# Patient Record
Sex: Female | Born: 1935 | Race: White | Hispanic: No | Smoking: Never smoker
Health system: Southern US, Community
[De-identification: ages and names within clinical notes are randomized; demographics above are authoritative.]

## PROBLEM LIST (undated history)

## (undated) DIAGNOSIS — F0280 Dementia in other diseases classified elsewhere without behavioral disturbance: Secondary | ICD-10-CM

## (undated) DIAGNOSIS — F32A Depression, unspecified: Secondary | ICD-10-CM

## (undated) DIAGNOSIS — Z515 Encounter for palliative care: Secondary | ICD-10-CM

## (undated) DIAGNOSIS — F329 Major depressive disorder, single episode, unspecified: Secondary | ICD-10-CM

## (undated) DIAGNOSIS — G309 Alzheimer's disease, unspecified: Secondary | ICD-10-CM

## (undated) DIAGNOSIS — D649 Anemia, unspecified: Secondary | ICD-10-CM

---

## 2009-07-29 ENCOUNTER — Inpatient Hospital Stay (HOSPITAL_COMMUNITY): Admission: EM | Admit: 2009-07-29 | Discharge: 2009-08-07 | Payer: Self-pay | Admitting: Emergency Medicine

## 2009-07-30 ENCOUNTER — Ambulatory Visit: Payer: Self-pay | Admitting: Vascular Surgery

## 2009-07-30 ENCOUNTER — Encounter (INDEPENDENT_AMBULATORY_CARE_PROVIDER_SITE_OTHER): Payer: Self-pay | Admitting: Internal Medicine

## 2009-07-31 ENCOUNTER — Encounter (INDEPENDENT_AMBULATORY_CARE_PROVIDER_SITE_OTHER): Payer: Self-pay | Admitting: Internal Medicine

## 2009-10-19 ENCOUNTER — Emergency Department (HOSPITAL_COMMUNITY): Admission: EM | Admit: 2009-10-19 | Discharge: 2009-10-19 | Payer: Self-pay | Admitting: Emergency Medicine

## 2009-11-29 ENCOUNTER — Inpatient Hospital Stay (HOSPITAL_COMMUNITY): Admission: EM | Admit: 2009-11-29 | Discharge: 2009-11-30 | Payer: Self-pay | Admitting: Emergency Medicine

## 2009-12-15 ENCOUNTER — Observation Stay (HOSPITAL_COMMUNITY): Admission: EM | Admit: 2009-12-15 | Discharge: 2009-12-16 | Payer: Self-pay | Admitting: Emergency Medicine

## 2010-01-24 ENCOUNTER — Emergency Department (HOSPITAL_COMMUNITY): Admission: EM | Admit: 2010-01-24 | Discharge: 2010-01-24 | Payer: Self-pay | Admitting: Emergency Medicine

## 2010-06-27 ENCOUNTER — Emergency Department (HOSPITAL_COMMUNITY)
Admission: EM | Admit: 2010-06-27 | Discharge: 2010-06-27 | Disposition: A | Discharge status: Skilled Nursing Facility | Payer: PRIVATE HEALTH INSURANCE | Attending: Emergency Medicine | Admitting: Emergency Medicine

## 2010-06-27 DIAGNOSIS — G309 Alzheimer's disease, unspecified: Secondary | ICD-10-CM | POA: Insufficient documentation

## 2010-06-27 DIAGNOSIS — F0280 Dementia in other diseases classified elsewhere without behavioral disturbance: Secondary | ICD-10-CM | POA: Insufficient documentation

## 2010-06-27 DIAGNOSIS — Y9210 Unspecified residential institution as the place of occurrence of the external cause: Secondary | ICD-10-CM | POA: Insufficient documentation

## 2010-06-27 DIAGNOSIS — F329 Major depressive disorder, single episode, unspecified: Secondary | ICD-10-CM | POA: Insufficient documentation

## 2010-06-27 DIAGNOSIS — R5381 Other malaise: Secondary | ICD-10-CM | POA: Insufficient documentation

## 2010-06-27 DIAGNOSIS — F3289 Other specified depressive episodes: Secondary | ICD-10-CM | POA: Insufficient documentation

## 2010-06-27 DIAGNOSIS — W06XXXA Fall from bed, initial encounter: Secondary | ICD-10-CM | POA: Insufficient documentation

## 2010-06-27 DIAGNOSIS — Z66 Do not resuscitate: Secondary | ICD-10-CM | POA: Insufficient documentation

## 2010-06-27 LAB — POCT I-STAT, CHEM 8
BUN: 29 mg/dL — ABNORMAL HIGH (ref 6–23)
Calcium, Ion: 1.13 mmol/L (ref 1.12–1.32)
Creatinine, Ser: 1.2 mg/dL (ref 0.4–1.2)
Glucose, Bld: 79 mg/dL (ref 70–99)
HCT: 40 % (ref 36.0–46.0)
Hemoglobin: 13.6 g/dL (ref 12.0–15.0)

## 2010-07-26 LAB — URINE CULTURE
Colony Count: NO GROWTH
Culture  Setup Time: 201108071207

## 2010-07-26 LAB — URINALYSIS, ROUTINE W REFLEX MICROSCOPIC
Glucose, UA: NEGATIVE mg/dL
Ketones, ur: NEGATIVE mg/dL
Specific Gravity, Urine: 1.014 (ref 1.005–1.030)

## 2010-07-26 LAB — CK TOTAL AND CKMB (NOT AT ARMC)
CK, MB: 2.2 ng/mL (ref 0.3–4.0)
Total CK: 53 U/L (ref 7–177)

## 2010-07-26 LAB — CARDIAC PANEL(CRET KIN+CKTOT+MB+TROPI)
CK, MB: 1.9 ng/mL (ref 0.3–4.0)
Relative Index: INVALID (ref 0.0–2.5)
Relative Index: INVALID (ref 0.0–2.5)
Troponin I: 0.01 ng/mL (ref 0.00–0.06)

## 2010-07-26 LAB — CBC
HCT: 35.3 % — ABNORMAL LOW (ref 36.0–46.0)
Hemoglobin: 11.1 g/dL — ABNORMAL LOW (ref 12.0–15.0)
MCHC: 31.4 g/dL (ref 30.0–36.0)
MCV: 82.5 fL (ref 78.0–100.0)
Platelets: 327 10*3/uL (ref 150–400)
RDW: 18.3 % — ABNORMAL HIGH (ref 11.5–15.5)

## 2010-07-26 LAB — BASIC METABOLIC PANEL
CO2: 26 mEq/L (ref 19–32)
Calcium: 8.7 mg/dL (ref 8.4–10.5)
GFR calc Af Amer: >60 mL/min (ref >60)
Glucose, Bld: 95 mg/dL (ref 70–99)
Potassium: 3.9 mEq/L (ref 3.5–5.1)

## 2010-07-26 LAB — DIFFERENTIAL
Basophils Absolute: 0 10*3/uL (ref 0.0–0.1)
Eosinophils Absolute: 0.3 10*3/uL (ref 0.0–0.7)
Lymphocytes Relative: 18 % (ref 12–46)
Monocytes Absolute: 0.9 10*3/uL (ref 0.1–1.0)

## 2010-07-27 LAB — DIFFERENTIAL
Basophils Relative: 0 % (ref 0–1)
Lymphs Abs: 1.1 10*3/uL (ref 0.7–4.0)
Monocytes Absolute: 0.8 10*3/uL (ref 0.1–1.0)
Neutro Abs: 5.1 10*3/uL (ref 1.7–7.7)

## 2010-07-27 LAB — IRON AND TIBC
Iron: 31 ug/dL — ABNORMAL LOW (ref 42–135)
TIBC: 345 ug/dL (ref 250–470)
UIBC: 314 ug/dL

## 2010-07-27 LAB — RETICULOCYTES
Retic Count, Absolute: 45.7 10*3/uL (ref 19.0–186.0)
Retic Ct Pct: 1.2 % (ref 0.4–3.1)

## 2010-07-27 LAB — BASIC METABOLIC PANEL
BUN: 24 mg/dL — ABNORMAL HIGH (ref 6–23)
Calcium: 8.6 mg/dL (ref 8.4–10.5)
Chloride: 106 mEq/L (ref 96–112)
Creatinine, Ser: 1.11 mg/dL (ref 0.4–1.2)
GFR calc non Af Amer: 46 mL/min — ABNORMAL LOW (ref >60)
GFR calc non Af Amer: 47 mL/min — ABNORMAL LOW (ref >60)
Glucose, Bld: 93 mg/dL (ref 70–99)
Potassium: 3.6 mEq/L (ref 3.5–5.1)
Sodium: 141 mEq/L (ref 135–145)
Sodium: 142 mEq/L (ref 135–145)

## 2010-07-27 LAB — CBC
HCT: 29.9 % — ABNORMAL LOW (ref 36.0–46.0)
Hemoglobin: 8.7 g/dL — ABNORMAL LOW (ref 12.0–15.0)
Hemoglobin: 9.9 g/dL — ABNORMAL LOW (ref 12.0–15.0)
MCHC: 32.3 g/dL (ref 30.0–36.0)
MCHC: 33 g/dL (ref 30.0–36.0)
RDW: 15.9 % — ABNORMAL HIGH (ref 11.5–15.5)
RDW: 17.6 % — ABNORMAL HIGH (ref 11.5–15.5)
WBC: 7.3 10*3/uL (ref 4.0–10.5)
WBC: 7.6 10*3/uL (ref 4.0–10.5)

## 2010-07-27 LAB — URINALYSIS, ROUTINE W REFLEX MICROSCOPIC
Bilirubin Urine: NEGATIVE
Glucose, UA: NEGATIVE mg/dL
Hgb urine dipstick: NEGATIVE
Ketones, ur: NEGATIVE mg/dL
Nitrite: NEGATIVE
Specific Gravity, Urine: 1.021 (ref 1.005–1.030)
Urobilinogen, UA: 0.2 mg/dL (ref 0.0–1.0)
pH: 6 (ref 5.0–8.0)

## 2010-07-27 LAB — CROSSMATCH: Antibody Screen: NEGATIVE

## 2010-07-27 LAB — FERRITIN: Ferritin: 14 ng/mL (ref 10–291)

## 2010-07-27 LAB — TSH: TSH: 4.231 u[IU]/mL (ref 0.350–4.500)

## 2010-07-27 LAB — URINE CULTURE: Culture: NO GROWTH

## 2010-07-27 LAB — VITAMIN B12: Vitamin B-12: 582 pg/mL (ref 211–911)

## 2010-07-27 LAB — MAGNESIUM: Magnesium: 2.1 mg/dL (ref 1.5–2.5)

## 2010-07-27 LAB — HEMOCCULT GUIAC POC 1CARD (OFFICE): Fecal Occult Bld: POSITIVE

## 2010-07-27 LAB — ABO/RH: ABO/RH(D): O POS

## 2010-07-27 LAB — MRSA PCR SCREENING: MRSA by PCR: NEGATIVE

## 2010-07-27 LAB — HEMOGLOBIN AND HEMATOCRIT, BLOOD: HCT: 30 % — ABNORMAL LOW (ref 36.0–46.0)

## 2010-08-05 LAB — BASIC METABOLIC PANEL
BUN: 14 mg/dL (ref 6–23)
BUN: 7 mg/dL (ref 6–23)
CO2: 24 mEq/L (ref 19–32)
Calcium: 8.4 mg/dL (ref 8.4–10.5)
Calcium: 8.6 mg/dL (ref 8.4–10.5)
Chloride: 102 mEq/L (ref 96–112)
Chloride: 106 mEq/L (ref 96–112)
Creatinine, Ser: 0.87 mg/dL (ref 0.4–1.2)
Creatinine, Ser: 0.99 mg/dL (ref 0.4–1.2)
GFR calc Af Amer: >60 mL/min (ref >60)
GFR calc Af Amer: >60 mL/min (ref >60)
GFR calc non Af Amer: 56 mL/min — ABNORMAL LOW (ref >60)
GFR calc non Af Amer: >60 mL/min (ref >60)
GFR calc non Af Amer: >60 mL/min (ref >60)
Glucose, Bld: 98 mg/dL (ref 70–99)
Potassium: 3.3 mEq/L — ABNORMAL LOW (ref 3.5–5.1)
Potassium: 3.6 mEq/L (ref 3.5–5.1)
Sodium: 140 mEq/L (ref 135–145)

## 2010-08-05 LAB — COMPREHENSIVE METABOLIC PANEL
ALT: 25 U/L (ref 0–35)
Albumin: 3.4 g/dL — ABNORMAL LOW (ref 3.5–5.2)
BUN: 18 mg/dL (ref 6–23)
Calcium: 8.4 mg/dL (ref 8.4–10.5)
Glucose, Bld: 77 mg/dL (ref 70–99)
Sodium: 134 mEq/L — ABNORMAL LOW (ref 135–145)
Total Protein: 6.6 g/dL (ref 6.0–8.3)

## 2010-08-05 LAB — FERRITIN: Ferritin: 174 ng/mL (ref 10–291)

## 2010-08-05 LAB — CBC
HCT: 23.4 % — ABNORMAL LOW (ref 36.0–46.0)
HCT: 40.7 % (ref 36.0–46.0)
Hemoglobin: 13.5 g/dL (ref 12.0–15.0)
Hemoglobin: 5.7 g/dL — CL (ref 12.0–15.0)
MCHC: 33.2 g/dL (ref 30.0–36.0)
MCHC: 33.9 g/dL (ref 30.0–36.0)
MCV: 92.9 fL (ref 78.0–100.0)
Platelets: 257 10*3/uL (ref 150–400)
RBC: 2.51 MIL/uL — ABNORMAL LOW (ref 3.87–5.11)
RBC: 4.38 MIL/uL (ref 3.87–5.11)
RDW: 14.4 % (ref 11.5–15.5)

## 2010-08-05 LAB — POCT I-STAT, CHEM 8
Creatinine, Ser: 0.8 mg/dL (ref 0.4–1.2)
HCT: 42 % (ref 36.0–46.0)
Hemoglobin: 14.3 g/dL (ref 12.0–15.0)
Potassium: 3.6 mEq/L (ref 3.5–5.1)
Sodium: 135 mEq/L (ref 135–145)
TCO2: 25 mmol/L (ref 0–100)

## 2010-08-05 LAB — CK TOTAL AND CKMB (NOT AT ARMC)
CK, MB: 6.4 ng/mL (ref 0.3–4.0)
Relative Index: 2.9 — ABNORMAL HIGH (ref 0.0–2.5)
Total CK: 219 U/L — ABNORMAL HIGH (ref 7–177)
Total CK: 326 U/L — ABNORMAL HIGH (ref 7–177)

## 2010-08-05 LAB — CROSSMATCH
ABO/RH(D): O POS
Antibody Screen: NEGATIVE

## 2010-08-05 LAB — URINE CULTURE: Culture: NO GROWTH

## 2010-08-05 LAB — DIFFERENTIAL
Lymphocytes Relative: 9 % — ABNORMAL LOW (ref 12–46)
Lymphs Abs: 0.4 10*3/uL — ABNORMAL LOW (ref 0.7–4.0)
Monocytes Absolute: 0.4 10*3/uL (ref 0.1–1.0)
Monocytes Relative: 9 % (ref 3–12)
Neutro Abs: 3.3 10*3/uL (ref 1.7–7.7)
Neutrophils Relative %: 81 % — ABNORMAL HIGH (ref 43–77)

## 2010-08-05 LAB — IRON AND TIBC
Iron: 36 ug/dL — ABNORMAL LOW (ref 42–135)
Saturation Ratios: 14 % — ABNORMAL LOW (ref 20–55)
UIBC: 230 ug/dL

## 2010-08-05 LAB — FOLATE: Folate: >20 ng/mL

## 2010-08-05 LAB — VITAMIN B12: Vitamin B-12: 973 pg/mL — ABNORMAL HIGH (ref 211–911)

## 2010-08-05 LAB — MAGNESIUM: Magnesium: 2 mg/dL (ref 1.5–2.5)

## 2010-08-05 LAB — RETICULOCYTES: Retic Ct Pct: 1.1 % (ref 0.4–3.1)

## 2010-08-05 LAB — TROPONIN I: Troponin I: 0.04 ng/mL (ref 0.00–0.06)

## 2010-08-05 LAB — ABO/RH: ABO/RH(D): O POS

## 2010-08-05 LAB — TSH: TSH: 4.529 u[IU]/mL — ABNORMAL HIGH (ref 0.350–4.500)

## 2010-08-05 LAB — HEMOCCULT GUIAC POC 1CARD (OFFICE): Fecal Occult Bld: NEGATIVE

## 2010-12-03 ENCOUNTER — Emergency Department (HOSPITAL_COMMUNITY)
Admission: EM | Admit: 2010-12-03 | Discharge: 2010-12-03 | Disposition: A | Discharge status: Home / Self Care | Payer: No Typology Code available for payment source | Attending: Emergency Medicine | Admitting: Emergency Medicine

## 2010-12-03 DIAGNOSIS — R51 Headache: Secondary | ICD-10-CM | POA: Insufficient documentation

## 2010-12-03 DIAGNOSIS — Z66 Do not resuscitate: Secondary | ICD-10-CM | POA: Insufficient documentation

## 2010-12-03 DIAGNOSIS — W010XXA Fall on same level from slipping, tripping and stumbling without subsequent striking against object, initial encounter: Secondary | ICD-10-CM | POA: Insufficient documentation

## 2010-12-03 DIAGNOSIS — F0390 Unspecified dementia without behavioral disturbance: Secondary | ICD-10-CM | POA: Insufficient documentation

## 2010-12-03 DIAGNOSIS — Y9210 Unspecified residential institution as the place of occurrence of the external cause: Secondary | ICD-10-CM | POA: Insufficient documentation

## 2010-12-03 DIAGNOSIS — T1490XA Injury, unspecified, initial encounter: Secondary | ICD-10-CM | POA: Insufficient documentation

## 2011-01-25 ENCOUNTER — Emergency Department (HOSPITAL_COMMUNITY): Payer: No Typology Code available for payment source

## 2011-01-25 ENCOUNTER — Emergency Department (HOSPITAL_COMMUNITY)
Admission: EM | Admit: 2011-01-25 | Discharge: 2011-01-25 | Disposition: A | Discharge status: Home / Self Care | Payer: No Typology Code available for payment source | Attending: Emergency Medicine | Admitting: Emergency Medicine

## 2011-01-25 DIAGNOSIS — S0003XA Contusion of scalp, initial encounter: Secondary | ICD-10-CM | POA: Insufficient documentation

## 2011-01-25 DIAGNOSIS — Z79899 Other long term (current) drug therapy: Secondary | ICD-10-CM | POA: Insufficient documentation

## 2011-01-25 DIAGNOSIS — G309 Alzheimer's disease, unspecified: Secondary | ICD-10-CM | POA: Insufficient documentation

## 2011-01-25 DIAGNOSIS — F3289 Other specified depressive episodes: Secondary | ICD-10-CM | POA: Insufficient documentation

## 2011-01-25 DIAGNOSIS — W19XXXA Unspecified fall, initial encounter: Secondary | ICD-10-CM | POA: Insufficient documentation

## 2011-01-25 DIAGNOSIS — IMO0002 Reserved for concepts with insufficient information to code with codable children: Secondary | ICD-10-CM | POA: Insufficient documentation

## 2011-01-25 DIAGNOSIS — I10 Essential (primary) hypertension: Secondary | ICD-10-CM | POA: Insufficient documentation

## 2011-01-25 DIAGNOSIS — R51 Headache: Secondary | ICD-10-CM | POA: Insufficient documentation

## 2011-01-25 DIAGNOSIS — S0180XA Unspecified open wound of other part of head, initial encounter: Secondary | ICD-10-CM | POA: Insufficient documentation

## 2011-01-25 DIAGNOSIS — F0280 Dementia in other diseases classified elsewhere without behavioral disturbance: Secondary | ICD-10-CM | POA: Insufficient documentation

## 2011-01-25 DIAGNOSIS — S1093XA Contusion of unspecified part of neck, initial encounter: Secondary | ICD-10-CM | POA: Insufficient documentation

## 2011-01-25 DIAGNOSIS — F329 Major depressive disorder, single episode, unspecified: Secondary | ICD-10-CM | POA: Insufficient documentation

## 2011-01-25 DIAGNOSIS — Y9210 Unspecified residential institution as the place of occurrence of the external cause: Secondary | ICD-10-CM | POA: Insufficient documentation

## 2011-01-25 DIAGNOSIS — N390 Urinary tract infection, site not specified: Secondary | ICD-10-CM | POA: Insufficient documentation

## 2011-01-25 LAB — URINALYSIS, ROUTINE W REFLEX MICROSCOPIC
Bilirubin Urine: NEGATIVE
Glucose, UA: NEGATIVE mg/dL
Ketones, ur: NEGATIVE mg/dL
Protein, ur: NEGATIVE mg/dL
Urobilinogen, UA: 0.2 mg/dL (ref 0.0–1.0)

## 2011-01-25 LAB — POCT I-STAT, CHEM 8
Calcium, Ion: 1.06 mmol/L — ABNORMAL LOW (ref 1.12–1.32)
Creatinine, Ser: 1.1 mg/dL (ref 0.50–1.10)
Hemoglobin: 13.9 g/dL (ref 12.0–15.0)
Sodium: 139 mEq/L (ref 135–145)
TCO2: 27 mmol/L (ref 0–100)

## 2011-01-25 LAB — GLUCOSE, CAPILLARY: Glucose-Capillary: 74 mg/dL (ref 70–99)

## 2011-01-27 LAB — URINE CULTURE: Culture  Setup Time: 201209151729

## 2011-07-12 ENCOUNTER — Encounter (HOSPITAL_COMMUNITY): Payer: Self-pay | Admitting: Family Medicine

## 2011-07-12 ENCOUNTER — Emergency Department (HOSPITAL_COMMUNITY)
Admission: EM | Admit: 2011-07-12 | Discharge: 2011-07-12 | Disposition: A | Discharge status: Skilled Nursing Facility | Payer: No Typology Code available for payment source | Attending: Emergency Medicine | Admitting: Emergency Medicine

## 2011-07-12 DIAGNOSIS — G309 Alzheimer's disease, unspecified: Secondary | ICD-10-CM | POA: Insufficient documentation

## 2011-07-12 DIAGNOSIS — W2209XA Striking against other stationary object, initial encounter: Secondary | ICD-10-CM | POA: Insufficient documentation

## 2011-07-12 DIAGNOSIS — IMO0002 Reserved for concepts with insufficient information to code with codable children: Secondary | ICD-10-CM | POA: Insufficient documentation

## 2011-07-12 DIAGNOSIS — S0990XA Unspecified injury of head, initial encounter: Secondary | ICD-10-CM | POA: Insufficient documentation

## 2011-07-12 DIAGNOSIS — S0081XA Abrasion of other part of head, initial encounter: Secondary | ICD-10-CM

## 2011-07-12 DIAGNOSIS — F0280 Dementia in other diseases classified elsewhere without behavioral disturbance: Secondary | ICD-10-CM | POA: Insufficient documentation

## 2011-07-12 DIAGNOSIS — Y9210 Unspecified residential institution as the place of occurrence of the external cause: Secondary | ICD-10-CM | POA: Insufficient documentation

## 2011-07-12 DIAGNOSIS — Z79899 Other long term (current) drug therapy: Secondary | ICD-10-CM | POA: Insufficient documentation

## 2011-07-12 HISTORY — DX: Alzheimer's disease, unspecified: G30.9

## 2011-07-12 HISTORY — DX: Dementia in other diseases classified elsewhere, unspecified severity, without behavioral disturbance, psychotic disturbance, mood disturbance, and anxiety: F02.80

## 2011-07-12 MED ORDER — TETANUS-DIPHTH-ACELL PERTUSSIS 5-2.5-18.5 LF-MCG/0.5 IM SUSP
0.5000 mL | Freq: Once | INTRAMUSCULAR | Status: AC
  Administered 2011-07-12: 0.5 mL via INTRAMUSCULAR
  Filled 2011-07-12: qty 0.5

## 2011-07-12 NOTE — ED Notes (Signed)
Per EMS, pt was walking and hit head on the corner of a wall. Pt has small abrasion to right eyebrow.

## 2011-07-12 NOTE — ED Provider Notes (Signed)
History     CSN: [id number]  Arrival date & time 07/12/11  1646   First MD Initiated Contact with Patient 07/12/11 1708      Chief Complaint  Patient presents with  . Head Injury    (Consider location/radiation/quality/duration/timing/severity/associated sxs/prior treatment) HPI Hx provided by EMS.  Unable to obtain Hx from the pt 2/2 severe dementia.  78 year old female with history of severe dementia sent from nursing home secondary to head injury. The injury occurred today at her nursing home.  Patient was walking and "hit her head on the corner of a wall."  No LOC, abnormal gait, or fall. Reportedly no change in patient's mental status. Patient denies pain, numbness, or weakness.  Patient was noted to have an abrasion to her right eyebrow and was sent for further evaluation.  Tetanus status unknown.   Past Medical History  Diagnosis Date  . Alzheimer's dementia     History reviewed. No pertinent past surgical history.  History reviewed. No pertinent family history.  History  Substance Use Topics  . Smoking status: Not on file  . Smokeless tobacco: Not on file  . Alcohol Use: No    OB History    Grav Para Term Preterm Abortions TAB SAB Ect Mult Living                  Review of Systems  Unable to perform ROS: Dementia  Constitutional: Negative for fever.  Respiratory: Negative for cough.   Cardiovascular: Negative for chest pain.  Gastrointestinal: Negative for abdominal pain.  Musculoskeletal: Negative for gait problem.  Skin: Positive for wound.  Neurological: Negative for syncope and weakness.    Allergies  Review of patient's allergies indicates no known allergies.  Home Medications   Current Outpatient Rx  Name Route Sig Dispense Refill  . ADULT MULTIVITAMIN W/MINERALS CH Oral Take 1 tablet by mouth daily.    Marland Kitchen OMEPRAZOLE 20 MG PO CPDR Oral Take 20 mg by mouth daily.    . SERTRALINE HCL 100 MG PO TABS Oral Take 100 mg by mouth daily.      BP  120/63  Pulse 57  Temp(Src) 98.3 F (36.8 C) (Oral)  Resp 18  SpO2 100%  Physical Exam  Nursing note and vitals reviewed. Constitutional: She is oriented to person, place, and time. No distress.       Thin, alert, oriented to first name only, pt with 2 baby dolls in her lap, smiles, follows commands although slowly  HENT:  Head: Normocephalic.    Right Ear: External ear normal.  Left Ear: External ear normal.  Nose: Nose normal.  Mouth/Throat: Oropharynx is clear and moist.       No sig edema, TTP, or deformity.  Eyes: Conjunctivae and EOM are normal. Pupils are equal, round, and reactive to light.       Pupils ~58mm  Neck: Normal range of motion. Neck supple.  Cardiovascular: Normal rate, regular rhythm and intact distal pulses.   No murmur heard. Pulmonary/Chest: Effort normal and breath sounds normal. No respiratory distress.  Abdominal: Soft. Bowel sounds are normal. There is no tenderness.  Musculoskeletal: Normal range of motion. She exhibits no edema.  Neurological: She is alert and oriented to person, place, and time.  Skin: Skin is warm and dry. No rash noted. She is not diaphoretic.  Psychiatric: She has a normal mood and affect. Judgment normal.    ED Course  Procedures (including critical care time)  Labs Reviewed - No data to  display No results found.   1. Abrasion of face       MDM  78 y.o. F with severe dementia s/p injury to her lateral Rt eye with resultant abrasion.  No h/o LOC, neuro Sx's, or AMS.  Pt denies pain.  No cervical TTP.  Abrasions to face = superficial and without deformity.  Tdap given.  Will d/c with PCP f/u PRN.      Particia Lather, MD 07/13/11 [phone]

## 2011-07-12 NOTE — ED Provider Notes (Signed)
5:40 PM  I performed a history and physical examination of Pamela Harvey and discussed her management with Dr. Lendell Caprice.  I agree with the history, physical, assessment, and plan of care, with the following exceptions: None  The patient is awake, alert, interactive, but confused. She is in no apparent distress, is smiling and laughing. Her pupils are equal round reactive to light there is a small abrasion at the lateral aspect of the right eyebrow, no hematoma, no deformity of cranial or facial bones. Patient is breathing easily in no apparent distress, skin is warm and dry.  I was present for the following procedures: None Time Spent in Critical Care of the patient: None Time spent in discussions with the patient and family: 5 minutes  Pamela Harvey D    Pamela Bonier, MD 07/12/11 [phone]

## 2011-07-12 NOTE — ED Notes (Signed)
ptar here to pick up patient,  Report called to [facility] at [facility].  No new orders.

## 2011-07-12 NOTE — Discharge Instructions (Signed)
Abrasions Abrasions are skin scrapes. Their treatment depends on how large and deep the abrasion is. Abrasions do not extend through all layers of the skin. A cut or lesion through all skin layers is called a laceration. HOME CARE INSTRUCTIONS   If you were given a dressing, change it at least once a day or as instructed by your caregiver. If the bandage sticks, soak it off with a solution of water or hydrogen peroxide.   Twice a day, wash the area with soap and water to remove all the cream/ointment. You may do this in a sink, under a tub faucet, or in a shower. Rinse off the soap and pat dry with a clean towel. Look for signs of infection (see below).   Reapply cream/ointment according to your caregiver's instruction. This will help prevent infection and keep the bandage from sticking. Telfa or gauze over the wound and under the dressing or wrap will also help keep the bandage from sticking.   If the bandage becomes wet, dirty, or develops a foul smell, change it as soon as possible.   Only take over-the-counter or prescription medicines for pain, discomfort, or fever as directed by your caregiver.  SEEK IMMEDIATE MEDICAL CARE IF:   Increasing pain in the wound.   Signs of infection develop: redness, swelling, surrounding area is tender to touch, or pus coming from the wound.   You have a fever.   Any foul smell coming from the wound or dressing.  Most skin wounds heal within ten days. Facial wounds heal faster. However, an infection may occur despite proper treatment. You should have the wound checked for signs of infection within 24 to 48 hours or sooner if problems arise. If you were not given a wound-check appointment, look closely at the wound yourself on the second day for early signs of infection listed above. MAKE SURE YOU:   Understand these instructions.   Will watch your condition.   Will get help right away if you are not doing well or get worse.  Document Released:  02/05/2005 Document Revised: 01/08/2011 Document Reviewed: 12/15/2007 [facility] Patient Information 2012 [place], [place].     Tetanus and Diphtheria Vaccine Your caregiver has suggested that you receive an immunization to prevent tetanus (lockjaw) and diphtheria. Tetanus and diphtheria are serious and deadly infectious diseases of the past that have been nearly wiped out by modern immunizations. Td or DT vaccines (shots) are the immunizations given to help prevent these illnesses. Td is the medical term for a standard tetanus dose, small diphtheria dose. DT means both in standard doses. ABOUT THE DISEASES Tetanus (lockjaw) and diphtheria are serious diseases. Tetanus is caused by a germ that lives in the soil. It enters the body through a cut or wound, often caused by a nail or broken piece of glass. You cannot catch tetanus from another person. Diphtheria spreads when germs pass from an infected person to the nose or throat of others. Tetanus causes serious, painful spasms of all muscles. It can lead to:  "Locking" of the muscles of the jaw and throat, so the patient cannot open his or her mouth or swallow.   Damage to the heart muscle.  Diphtheria causes a thick coating in the nose, throat, or airway. It can lead to:  Breathing problems.   Kidney problems.   Heart failure.   Paralysis.   Death.  ABOUT THE VACCINES  A vaccine is a shot (immunization) that can help prevent a disease. Vaccines have helped lower  the rates of getting certain diseases. If people stopped getting vaccinated, more people would develop illnesses. These vaccines can be used in three ways:  As catch-up for people who did not get all their doses when they were children.   As a booster dose every 10 years.   For protection against tetanus infection, after a wound.  Benefits of the vaccines Vaccination is the best way to protect against tetanus and diphtheria. Because of vaccination, there are fewer cases of  these diseases. Cases are rare in children because most get a routine vaccination with DTP (Diphtheria, Tetanus, and Pertussis), DTaP (Diphtheria, Tetanus, and acellular Pertussis), or DT (Diphtheria and Tetanus) vaccines. There would be many more cases if we stopped vaccinating people. Tetanus kills about 1 in 5 people who are infected. WHEN SHOULD YOU GET TD VACCINE?  Td is made for people 73 years of age and older.   People who have not gotten at least 3 doses of any tetanus and diphtheria vaccine (DTP, DTaP or DT) during their lifetime should do so using Td. After a person gets the third dose, a Td dose is needed every 10 years all through life. This is because protection fades over time. Booster shots are needed every 10 years.   Other vaccines may be given at the same time as Td.  You may not know today whether your immunizations are current. The vaccine given today is to protect you from your next cut or injury. It does not offer protection for the current injury. An immune globulin injection may be given, if protection is needed immediately. Check with your caregiver later regarding your immunization status. Tell your caregiver if the person getting the vaccine:  Has ever had a serious allergic reaction or other problem with Td, or any other tetanus and diphtheria vaccine (DTP, DTaP, or DT). People who have had a serious allergic reaction should not receive the vaccine.   Has epilepsy or another nervous system illness.   Has had Guillain Barre Syndrome (GBS) in the past.   Now has a moderate or severe illness.   Is pregnant.   If you are not sure, ask your caregiver.  WHAT ARE THE RISKS FROM TD VACCINE?  As with any medicine, there are very small risks that serious problems, even death, could occur after getting a vaccine. However, the risk of a serious side effect from the vaccine is almost zero.   The risks from the vaccine are much smaller than the risks from the diseases, if  people stopped getting vaccinated. Both diseases can cause serious health problems, which are prevented by the vaccine.   Almost all people who get Td have no problems from it.  Mild problems If mild problems occur, they usually start within hours to a day or two after vaccination. They may last 1-2 days:  Soreness, redness, or swelling where the shot was given.   Headache or tiredness.   Occasionally, a low grade fever.  These problems can be worse in adults who get Td vaccine very often. Non-aspirin medicines may be used to reduce soreness. Severe problems These problems happen very rarely:  Serious allergic reaction (at most, occurs in 1 in 1 million vaccinated persons). This occurs almost immediately, and is treatable with medicines. Signs of a serious allergic reaction include:   Difficulty breathing.   Hoarseness or wheezing.   Hives.   Dizziness.   Deep, aching pain and muscle wasting in upper arm(s).  Overall, the benefits to you  and your family from these vaccines are far greater than the risk. WHAT TO DO IF THERE IS A SERIOUS REACTION:  Call a caregiver or get the person to a doctor or emergency room right away.   Write down what happened, the date and time it happened, and tell your caregiver.   Ask your caregiver to file a Vaccine Adverse Event Report form or call, toll-free: [phone]  If you want to learn more about this vaccine, ask your caregiver. She/he can give you the vaccine package insert or suggest other sources of information. Also, the [employer] gives compensation (payment) for persons thought to be injured by vaccines. For details call, toll-free: [phone]. Document Released: 04/25/2000 Document Revised: 01/08/2011 Document Reviewed: 03/15/2009 [facility] Patient Information 2012 [place], [place].

## 2011-07-16 NOTE — ED Provider Notes (Signed)
Evaluation and management procedures were performed by the resident physician under my supervision/collaboration.    Felisa Bonier, MD 07/16/11 2033

## 2011-09-15 ENCOUNTER — Emergency Department (HOSPITAL_COMMUNITY): Payer: No Typology Code available for payment source

## 2011-09-15 ENCOUNTER — Emergency Department (HOSPITAL_COMMUNITY)
Admission: EM | Admit: 2011-09-15 | Discharge: 2011-09-15 | Disposition: A | Discharge status: Home / Self Care | Payer: No Typology Code available for payment source | Attending: Emergency Medicine | Admitting: Emergency Medicine

## 2011-09-15 ENCOUNTER — Encounter (HOSPITAL_COMMUNITY): Payer: Self-pay | Admitting: *Deleted

## 2011-09-15 DIAGNOSIS — F329 Major depressive disorder, single episode, unspecified: Secondary | ICD-10-CM | POA: Insufficient documentation

## 2011-09-15 DIAGNOSIS — S0083XA Contusion of other part of head, initial encounter: Secondary | ICD-10-CM

## 2011-09-15 DIAGNOSIS — F0280 Dementia in other diseases classified elsewhere without behavioral disturbance: Secondary | ICD-10-CM | POA: Insufficient documentation

## 2011-09-15 DIAGNOSIS — F3289 Other specified depressive episodes: Secondary | ICD-10-CM | POA: Insufficient documentation

## 2011-09-15 DIAGNOSIS — I498 Other specified cardiac arrhythmias: Secondary | ICD-10-CM | POA: Insufficient documentation

## 2011-09-15 DIAGNOSIS — Z79899 Other long term (current) drug therapy: Secondary | ICD-10-CM | POA: Insufficient documentation

## 2011-09-15 DIAGNOSIS — W19XXXA Unspecified fall, initial encounter: Secondary | ICD-10-CM | POA: Insufficient documentation

## 2011-09-15 DIAGNOSIS — S0180XA Unspecified open wound of other part of head, initial encounter: Secondary | ICD-10-CM | POA: Insufficient documentation

## 2011-09-15 DIAGNOSIS — G309 Alzheimer's disease, unspecified: Secondary | ICD-10-CM | POA: Insufficient documentation

## 2011-09-15 DIAGNOSIS — S12100A Unspecified displaced fracture of second cervical vertebra, initial encounter for closed fracture: Secondary | ICD-10-CM | POA: Insufficient documentation

## 2011-09-15 DIAGNOSIS — S12110A Anterior displaced Type II dens fracture, initial encounter for closed fracture: Secondary | ICD-10-CM

## 2011-09-15 DIAGNOSIS — Y9210 Unspecified residential institution as the place of occurrence of the external cause: Secondary | ICD-10-CM | POA: Insufficient documentation

## 2011-09-15 DIAGNOSIS — S0003XA Contusion of scalp, initial encounter: Secondary | ICD-10-CM | POA: Insufficient documentation

## 2011-09-15 HISTORY — DX: Depression, unspecified: F32.A

## 2011-09-15 HISTORY — DX: Major depressive disorder, single episode, unspecified: F32.9

## 2011-09-15 NOTE — ED Notes (Signed)
Per EMS pt from [facility], staff reports found pt on floor around 0430 this am. Did not call EMS at that time, no injuries noted. Staff re-evaluated at 0700 and found hematoma to right side of forehead. Staff states pt alert/oriented per norm. Hx of alzheimers/dementia. VSS. No blood thinners.

## 2011-09-15 NOTE — Discharge Instructions (Signed)
Keep the cervical collar on at all times except for bathing. Followup with the neurosurgeon in 2 weeks.  Cervical Spine Fracture, Stable The muscles and ligaments in your neck have been stretched and a bone in your neck appears to be broken. This is known as a cervical spine fracture. This fracture appears to be stable. This means that the chances of it causing you problems while it is healing are very small. Your caregiver feels that no harm will come to you if you are followed up at home. DIAGNOSIS  The diagnosis of your injury had been made with X-rays of your neck. Often CT scans and or MRI is used to confirm the diagnosis and clarify how your injury should be treated. Generally it is the examination of your neck, arms and legs and the history of your injury which prompts the caregiver to order these tests. HOME CARE INSTRUCTIONS  Ice packs to the neck or areas of pain approximately 3 to 4 times a day for 15 to 20 minutes. Do this for 2 days.   If given a cervical collar, wear as instructed. Do not remove any collar unless instructed by a caregiver.   Only take over-the-counter or prescription medicines for pain, discomfort, or fever as directed by your caregiver.   If your caregiver has given you a follow up appointment if is very important to keep that appointment. Not keeping the appointment could result in a chronic or permanent injury, pain, and disability. If there is any problem keeping the appointment you must call back to this facility for assistance.  Recheck with the hospital or clinic after a radiologist has read your X-rays if this was not done when you were initially evaluated. This will determine if further studies are necessary. It is your responsibility to obtain your X-ray results. Ask your caregiver how you are to be informed about your X-ray results. X-rays may be repeated in one week to three weeks. This is to:  Make sure that hairline fractures not detected on the first  X-rays are not overlooked.   Find ligaments that are completely disrupted which have left your neck unstable.  SEEK IMMEDIATE MEDICAL CARE IF:   You have increasing pain in your neck.   You develop difficulties swallowing or breathing.   You have numbness, weakness, burning pain or movement problems in the arms or legs.   You have difficulty walking.   You develop bowel or bladder retention or incontinence.   You have problems with coordination.  MAKE SURE YOU:   Understand these instructions.   Will watch your condition.   Will get help right away if you are not doing well or get worse.  Document Released: 03/15/2004 Document Revised: 04/17/2011 Document Reviewed: 12/10/2007 [facility] Patient Information 2012 [place], [place].

## 2011-09-15 NOTE — ED Provider Notes (Addendum)
History     CSN: [id number]  Arrival date & time 09/15/11  0712   First MD Initiated Contact with Patient 09/15/11 [phone]      Chief Complaint  Patient presents with  . Fall  . Bleeding/Bruising    (Consider location/radiation/quality/duration/timing/severity/associated sxs/prior treatment) Patient is a 78 y.o. female presenting with fall. The history is provided by the nursing home. The history is limited by the condition of the patient (dementia).  Fall  She was reported to have been found on the floor and the nursing home where she lives. She did not seem to have any injury at that time, but this morning, she was noted to have bruising on the right side of her for head. She was reported to be ambulatory and her mental status is reported to be unchanged from her baseline. Her medication list does not include any anticoagulants or antiplatelet medications.  Past Medical History  Diagnosis Date  . Alzheimer's dementia   . Depressed     History reviewed. No pertinent past surgical history.  No family history on file.  History  Substance Use Topics  . Smoking status: Not on file  . Smokeless tobacco: Not on file  . Alcohol Use: No    OB History    Grav Para Term Preterm Abortions TAB SAB Ect Mult Living                  Review of Systems  Unable to perform ROS: Dementia    Allergies  Review of patient's allergies indicates no known allergies.  Home Medications   Current Outpatient Rx  Name Route Sig Dispense Refill  . ADULT MULTIVITAMIN W/MINERALS CH Oral Take 1 tablet by mouth daily.    Marland Kitchen OMEPRAZOLE 20 MG PO CPDR Oral Take 20 mg by mouth daily.    . SERTRALINE HCL 100 MG PO TABS Oral Take 100 mg by mouth daily.      There were no vitals taken for this visit.  Physical Exam  Nursing note and vitals reviewed.  78 year old female who is awake and alert and in no acute distress. Vital signs are significant for mild bradycardia with heart rate of 51, mild  hypertension with blood pressure 161/57. Oxygen saturation is 100% which is normal. Head is normocephalic. Ecchymosis and superficial laceration is seen over the right side of the forehead with some ecchymosis extending into the right upper eyelid. There is no periorbital swelling or step-off. There is no tenderness to palpation and no deformity. PERRLA, EOMI. Neck is nontender. Back is nontender. Lungs are clear without rales, wheezes, or rhonchi. Heart has regular rate and rhythm without murmur. Abdomen is soft, flat, nontender without masses or hepatosplenomegaly. Extremities have no cyanosis or edema, full range of motion is present. Skin is warm and dry without rash. Neurologic: She is awake and alert. She is oriented to person, but not to place or time. She will answer questions with yes or no but no further speech is evident. She does follow commands to a limited extent. There no gross motor or sensory deficits cranial nerves are grossly intact.  ED Course  Procedures (including critical care time) Ct Head Wo Contrast  09/15/2011  *RADIOLOGY REPORT*  Clinical Data:  Fall in the nursing home.  Bleeding and bruising.  CT HEAD WITHOUT CONTRAST CT CERVICAL SPINE WITHOUT CONTRAST  Technique:  Multidetector CT imaging of the head and cervical spine was performed following the standard protocol without intravenous contrast.  Multiplanar CT image reconstructions  of the cervical spine were also generated.  Comparison:  CT head and cervical spine 01/25/2012.  CT HEAD  Findings: A right supraorbital scalp hematoma is present.  There is no underlying fracture.  The patient is status post bilateral lens extractions.  The globes are otherwise intact.  The nasal bones are intact.  The paranasal sinuses and mastoid air cells are clear. Bilateral parietal thinning is stable.  Bilateral parietal thinning is a chronic finding.  Atrophy and extensive white matter hypoattenuation is unchanged. No acute cortical infarct,  hemorrhage, mass lesion is present.  The ventricles are proportionate to the degree of atrophy.  No significant extra-axial fluid collection is present.  IMPRESSION:  1.  Large right to frontal and periorbital scalp hematoma without underlying fracture. 2.  Stable atrophy and extensive white matter disease.  CT CERVICAL SPINE  Findings: A type 2 dens fracture is displaced posteriorly by 2 mm. There is slight posterior subluxation of the C1-2 articulations. The anterior arch of C1 is intact.  No additional fractures are present.  Multilevel spondylosis is stable.  Emphysematous changes are present.  The lung apices are otherwise clear.  IMPRESSION:  1.  Type 2 dens fracture is displaced posteriorly by 2 mm. 2.  Stable spondylosis. 3.  Emphysema.  Critical Value/emergent results were called by telephone at the time of interpretation on 09/15/2011  at 08:24 a.m.  to  Dr. Preston Fleeting, who verbally acknowledged these results.  Original Report Authenticated By: Jamesetta Orleans. MATTERN, M.D.   Ct Cervical Spine Wo Contrast  09/15/2011  *RADIOLOGY REPORT*  Clinical Data:  Fall in the nursing home.  Bleeding and bruising.  CT HEAD WITHOUT CONTRAST CT CERVICAL SPINE WITHOUT CONTRAST  Technique:  Multidetector CT imaging of the head and cervical spine was performed following the standard protocol without intravenous contrast.  Multiplanar CT image reconstructions of the cervical spine were also generated.  Comparison:  CT head and cervical spine 01/25/2012.  CT HEAD  Findings: A right supraorbital scalp hematoma is present.  There is no underlying fracture.  The patient is status post bilateral lens extractions.  The globes are otherwise intact.  The nasal bones are intact.  The paranasal sinuses and mastoid air cells are clear. Bilateral parietal thinning is stable.  Bilateral parietal thinning is a chronic finding.  Atrophy and extensive white matter hypoattenuation is unchanged. No acute cortical infarct, hemorrhage, mass lesion  is present.  The ventricles are proportionate to the degree of atrophy.  No significant extra-axial fluid collection is present.  IMPRESSION:  1.  Large right to frontal and periorbital scalp hematoma without underlying fracture. 2.  Stable atrophy and extensive white matter disease.  CT CERVICAL SPINE  Findings: A type 2 dens fracture is displaced posteriorly by 2 mm. There is slight posterior subluxation of the C1-2 articulations. The anterior arch of C1 is intact.  No additional fractures are present.  Multilevel spondylosis is stable.  Emphysematous changes are present.  The lung apices are otherwise clear.  IMPRESSION:  1.  Type 2 dens fracture is displaced posteriorly by 2 mm. 2.  Stable spondylosis. 3.  Emphysema.  Critical Value/emergent results were called by telephone at the time of interpretation on 09/15/2011  at 08:24 a.m.  to  Dr. Preston Fleeting, who verbally acknowledged these results.  Original Report Authenticated By: Jamesetta Orleans. MATTERN, M.D.   CT scan shows a dens fracture. I have reviewed the images and discussed them with the radiologist Dr. Alfredo Batty. Case is discussed with Dr. Jordan Likes  who is on call for neurosurgery. He is reviewed the images and has requested the patient be placed in a stiff cervical collar and followup in his office in 2 weeks.  Dr.Pool felt that the fracture appears old. Radiologist had commented that the fracture is new compared with last scan obtained. The last scan obtained was in September. There was no fracture at that time, but fracture could have occurred at any time in the ensuing 8 months.  1. Fall   2. Dens fracture   3. Contusion of forehead       MDM  Fall with her bruising to her head. CT will be obtained to rule out intracranial injury. Old records have been reviewed and she has several other ED visits for falls.        Dione Booze, MD 09/15/11 1610  Dione Booze, MD 09/15/11 [phone]

## 2011-09-15 NOTE — ED Notes (Signed)
PT[facility] contacted to transport pt back to [facility]. 

## 2011-09-15 NOTE — ED Notes (Signed)
Attempted to call [facility] x 3 with no answer. Will given report to PTAR to give to Nursing Facility.

## 2011-09-15 NOTE — ED Notes (Signed)
Patient transported to CT 

## 2011-11-01 ENCOUNTER — Emergency Department (HOSPITAL_COMMUNITY)
Admission: EM | Admit: 2011-11-01 | Discharge: 2011-11-01 | Disposition: A | Discharge status: Skilled Nursing Facility | Payer: No Typology Code available for payment source | Attending: Emergency Medicine | Admitting: Emergency Medicine

## 2011-11-01 ENCOUNTER — Encounter (HOSPITAL_COMMUNITY): Payer: Self-pay

## 2011-11-01 ENCOUNTER — Emergency Department (HOSPITAL_COMMUNITY): Payer: No Typology Code available for payment source

## 2011-11-01 DIAGNOSIS — W06XXXA Fall from bed, initial encounter: Secondary | ICD-10-CM | POA: Insufficient documentation

## 2011-11-01 DIAGNOSIS — Y9210 Unspecified residential institution as the place of occurrence of the external cause: Secondary | ICD-10-CM | POA: Insufficient documentation

## 2011-11-01 DIAGNOSIS — M542 Cervicalgia: Secondary | ICD-10-CM | POA: Insufficient documentation

## 2011-11-01 DIAGNOSIS — G309 Alzheimer's disease, unspecified: Secondary | ICD-10-CM | POA: Insufficient documentation

## 2011-11-01 DIAGNOSIS — F3289 Other specified depressive episodes: Secondary | ICD-10-CM | POA: Insufficient documentation

## 2011-11-01 DIAGNOSIS — F0280 Dementia in other diseases classified elsewhere without behavioral disturbance: Secondary | ICD-10-CM | POA: Insufficient documentation

## 2011-11-01 DIAGNOSIS — Z79899 Other long term (current) drug therapy: Secondary | ICD-10-CM | POA: Insufficient documentation

## 2011-11-01 DIAGNOSIS — S129XXA Fracture of neck, unspecified, initial encounter: Secondary | ICD-10-CM | POA: Insufficient documentation

## 2011-11-01 DIAGNOSIS — S0003XA Contusion of scalp, initial encounter: Secondary | ICD-10-CM | POA: Insufficient documentation

## 2011-11-01 DIAGNOSIS — W19XXXA Unspecified fall, initial encounter: Secondary | ICD-10-CM

## 2011-11-01 DIAGNOSIS — F329 Major depressive disorder, single episode, unspecified: Secondary | ICD-10-CM | POA: Insufficient documentation

## 2011-11-01 NOTE — ED Notes (Signed)
Patient transported to CT 

## 2011-11-01 NOTE — ED Notes (Signed)
Per EMS pt from NH w/an un witnessed fall, staff was unsure how long pt was down, pt was found beside her bed lying on her (L) side. Pt has no complaints, presents w/an abrasion and small hematoma above (L) eyebrow. Pt has a hx of dementia, unable to give a verbal reports of the incident.

## 2011-11-01 NOTE — ED Notes (Signed)
Family at bedside. Cyndi Deshotels pt's daughter in law at bedside, she reports pt unable to tolerate hard collar she was d/c home with, pt's pcp placed pt in a soft collar.

## 2011-11-01 NOTE — ED Notes (Signed)
Per EMS pt from [facility], pt fell out of bed this am, un witnessed fall, approx downtime <10 mins, pt w/hematome and skin tear above (L) eye, no LOC, no obvious injuries, pt c/o of no pain, able to ambulate w/assistance

## 2011-11-01 NOTE — ED Notes (Signed)
Patient denies pain and is resting comfortably.  

## 2011-11-01 NOTE — ED Provider Notes (Signed)
History     CSN: [id number]  Arrival date & time 11/01/11  4098   First MD Initiated Contact with Patient 11/01/11 [phone]      Chief Complaint  Patient presents with  . Fall     HPI Per EMS pt from [facility], pt fell out of bed this am, un witnessed fall, approx downtime <10 mins, pt w/hematome and skin tear above (L) eye, no LOC, no obvious injuries, pt c/o of no pain, able to ambulate w/assistance.  Patient presented with a soft foam collar which was very loose on her neck.  Was not serving any purpose so was removed.  Review of records reveals that she was diagnosed with a cervical fracture on may 6 secondary to a fall.  She apparently was discharged home in some type of rigid collar but obviously she was not wearing neck collar which came here.  Past Medical History  Diagnosis Date  . Alzheimer's dementia   . Depressed     History reviewed. No pertinent past surgical history.  History reviewed. No pertinent family history.  History  Substance Use Topics  . Smoking status: Not on file  . Smokeless tobacco: Not on file  . Alcohol Use: No    OB History    Grav Para Term Preterm Abortions TAB SAB Ect Mult Living                  Review of Systems  Unable to perform ROS: Dementia    Allergies  Review of patient's allergies indicates no known allergies.  Home Medications   Current Outpatient Rx  Name Route Sig Dispense Refill  . ACETAMINOPHEN 325 MG PO TABS Oral Take 325 mg by mouth 3 (three) times daily as needed. For pain    . ADULT MULTIVITAMIN W/MINERALS CH Oral Take 1 tablet by mouth daily.    Marland Kitchen OMEPRAZOLE 20 MG PO CPDR Oral Take 20 mg by mouth daily.    Marland Kitchen POLYETHYLENE GLYCOL 3350 PO POWD Oral Take 17 g by mouth daily as needed. For constipation    . SERTRALINE HCL 100 MG PO TABS Oral Take 100 mg by mouth daily.    Marland Kitchen VALACYCLOVIR HCL 1 G PO TABS Oral Take 1,000 mg by mouth 2 (two) times daily. For 7 days (started on 10/14/11 and finished on 061013)      BP  136/64  Pulse 56  Temp 98.5 F (36.9 C) (Oral)  Resp 18  SpO2 100%  Physical Exam  Nursing note and vitals reviewed. Constitutional: She appears well-developed and well-nourished. No distress.  HENT:  Head: Normocephalic.    Eyes: Pupils are equal, round, and reactive to light.  Neck: Muscular tenderness present. No spinous process tenderness present.  Cardiovascular: Normal rate and intact distal pulses.   Pulmonary/Chest: No respiratory distress.  Abdominal: Normal appearance. She exhibits no distension.  Musculoskeletal: Normal range of motion.  Neurological: She is alert. No cranial nerve deficit.  Skin: Skin is warm and dry. No rash noted.  Psychiatric: She has a normal mood and affect. Her behavior is normal.    ED Course  Procedures (including critical care time)  Labs Reviewed - No data to display Ct Head Wo Contrast  11/01/2011  *RADIOLOGY REPORT*  Clinical Data:  78 year old female with fall, confusion, headache and neck pain.  CT HEAD WITHOUT CONTRAST CT CERVICAL SPINE WITHOUT CONTRAST  Technique:  Multidetector CT imaging of the head and cervical spine was performed following the standard protocol without intravenous contrast.  Multiplanar CT image reconstructions of the cervical spine were also generated.  Comparison:  09/15/2011 CT  CT HEAD  Findings: Moderate generalized cerebral atrophy and moderate to severe chronic small vessel white matter ischemic changes are again noted.  No acute intracranial abnormalities are identified, including mass lesion or mass effect, hydrocephalus, extra-axial fluid collection, midline shift, hemorrhage, or acute infarction.  The visualized bony calvarium is unremarkable. Left forehead soft tissue swelling is identified.  IMPRESSION: No evidence of acute intracranial abnormality.  Left forehead soft tissue swelling without fracture.  Atrophy and chronic small vessel white matter ischemic changes.  CT CERVICAL SPINE  Findings: A type 2  odontoid fracture with 2 mm displacement is unchanged since 09/15/2011. There is no evidence of new fracture, subluxation or prevertebral soft tissue swelling. Moderate multilevel degenerative disc disease, spondylosis and facet arthropathy is again identified contributing to mild to moderate central spinal and foraminal narrowing at several levels. The soft tissue structures are unremarkable.  IMPRESSION: No evidence of acute abnormality.  Unchanged type 2 odontoid fracture with 2 mm displacement.  Multilevel degenerative changes.  Original Report Authenticated By: Rosendo Gros, M.D.   Ct Cervical Spine Wo Contrast  11/01/2011  *RADIOLOGY REPORT*  Clinical Data:  78 year old female with fall, confusion, headache and neck pain.  CT HEAD WITHOUT CONTRAST CT CERVICAL SPINE WITHOUT CONTRAST  Technique:  Multidetector CT imaging of the head and cervical spine was performed following the standard protocol without intravenous contrast.  Multiplanar CT image reconstructions of the cervical spine were also generated.  Comparison:  09/15/2011 CT  CT HEAD  Findings: Moderate generalized cerebral atrophy and moderate to severe chronic small vessel white matter ischemic changes are again noted.  No acute intracranial abnormalities are identified, including mass lesion or mass effect, hydrocephalus, extra-axial fluid collection, midline shift, hemorrhage, or acute infarction.  The visualized bony calvarium is unremarkable. Left forehead soft tissue swelling is identified.  IMPRESSION: No evidence of acute intracranial abnormality.  Left forehead soft tissue swelling without fracture.  Atrophy and chronic small vessel white matter ischemic changes.  CT CERVICAL SPINE  Findings: A type 2 odontoid fracture with 2 mm displacement is unchanged since 09/15/2011. There is no evidence of new fracture, subluxation or prevertebral soft tissue swelling. Moderate multilevel degenerative disc disease, spondylosis and facet arthropathy is  again identified contributing to mild to moderate central spinal and foraminal narrowing at several levels. The soft tissue structures are unremarkable.  IMPRESSION: No evidence of acute abnormality.  Unchanged type 2 odontoid fracture with 2 mm displacement.  Multilevel degenerative changes.  Original Report Authenticated By: Rosendo Gros, M.D.     1. Fall   2. Cervical spine fracture       MDM  I spoke with the patient's daughter states that they removed her hard collar at the nursing home and she is wearing a soft collar just for comfort only.  She understands the risk involved with this but they decided that she cannot tolerate the hard collar.  At time of discharge patient moving all extremities and had no neurological deficits.        Nelia Shi, MD 11/01/11 [phone]

## 2011-11-30 ENCOUNTER — Encounter (HOSPITAL_COMMUNITY): Payer: Self-pay | Admitting: Emergency Medicine

## 2011-11-30 ENCOUNTER — Emergency Department (HOSPITAL_COMMUNITY): Payer: No Typology Code available for payment source

## 2011-11-30 ENCOUNTER — Emergency Department (HOSPITAL_COMMUNITY)
Admission: EM | Admit: 2011-11-30 | Discharge: 2011-12-01 | Disposition: A | Discharge status: Home / Self Care | Payer: No Typology Code available for payment source | Attending: Emergency Medicine | Admitting: Emergency Medicine

## 2011-11-30 DIAGNOSIS — S0990XA Unspecified injury of head, initial encounter: Secondary | ICD-10-CM | POA: Insufficient documentation

## 2011-11-30 DIAGNOSIS — R296 Repeated falls: Secondary | ICD-10-CM | POA: Insufficient documentation

## 2011-11-30 DIAGNOSIS — Z79899 Other long term (current) drug therapy: Secondary | ICD-10-CM | POA: Insufficient documentation

## 2011-11-30 DIAGNOSIS — R51 Headache: Secondary | ICD-10-CM | POA: Insufficient documentation

## 2011-11-30 DIAGNOSIS — G309 Alzheimer's disease, unspecified: Secondary | ICD-10-CM | POA: Insufficient documentation

## 2011-11-30 DIAGNOSIS — Y9210 Unspecified residential institution as the place of occurrence of the external cause: Secondary | ICD-10-CM | POA: Insufficient documentation

## 2011-11-30 DIAGNOSIS — F0280 Dementia in other diseases classified elsewhere without behavioral disturbance: Secondary | ICD-10-CM | POA: Insufficient documentation

## 2011-11-30 NOTE — ED Provider Notes (Signed)
History     CSN: [id number]  Arrival date & time 11/30/11  1507   First MD Initiated Contact with Patient 11/30/11 1508      Chief Complaint  Patient presents with  . Fall    (Consider location/radiation/quality/duration/timing/severity/associated sxs/prior treatment) HPI Comments: Pt brought by EMS from [facility] facility after sustaining a fall.  Has hx of dementia and frequent fall.  Was witnessed and apparently his head during fall.  Per staff, at baseline mental status.  No known recent illnesses.  Has hx of odontiod fx on May 6th, but was not tolerating hard collar at [facility].  Patient is a 78 y.o. female presenting with fall. The history is provided by the patient. History Limited By: dementia.  Fall The accident occurred 1 to 2 hours ago.    Past Medical History  Diagnosis Date  . Alzheimer's dementia   . Depressed     History reviewed. No pertinent past surgical history.  No family history on file.  History  Substance Use Topics  . Smoking status: Not on file  . Smokeless tobacco: Not on file  . Alcohol Use: No    OB History    Grav Para Term Preterm Abortions TAB SAB Ect Mult Living                  Review of Systems  Unable to perform ROS: Dementia    Allergies  Review of patient's allergies indicates no known allergies.  Home Medications   Current Outpatient Rx  Name Route Sig Dispense Refill  . ACETAMINOPHEN 325 MG PO TABS Oral Take 325 mg by mouth 3 (three) times daily as needed. For pain    . ADULT MULTIVITAMIN W/MINERALS CH Oral Take 1 tablet by mouth daily.    Marland Kitchen OMEPRAZOLE 20 MG PO CPDR Oral Take 20 mg by mouth daily.    Marland Kitchen POLYETHYLENE GLYCOL 3350 PO POWD Oral Take 17 g by mouth daily as needed. For constipation    . SERTRALINE HCL 100 MG PO TABS Oral Take 100 mg by mouth daily.      BP 154/64  Pulse 64  SpO2 97%  Physical Exam  Constitutional: She appears well-developed and well-nourished.  HENT:  Head: Normocephalic and atraumatic.    Mouth/Throat: Oropharynx is clear and moist.  Eyes: Pupils are equal, round, and reactive to light.  Neck:       No obvious tenderness/step off or deformity to cervical/thoracic/or LS spine  Cardiovascular: Normal rate, regular rhythm and normal heart sounds.   Pulmonary/Chest: Effort normal and breath sounds normal. No respiratory distress. She has no wheezes. She has no rales. She exhibits no tenderness.       No obvious trauma to chest/abdomen  Abdominal: Soft. Bowel sounds are normal. There is no tenderness. There is no rebound and no guarding.  Musculoskeletal: Normal range of motion. She exhibits no edema.       No pain on palpation or ROM of extremities  Lymphadenopathy:    She has no cervical adenopathy.  Neurological: She is alert.       Alert with eyes open.  Does not answer questions  Skin: Skin is warm and dry. No rash noted.  Psychiatric: She has a normal mood and affect.    ED Course  Procedures (including critical care time)   Ct Head Wo Contrast  11/30/2011  *RADIOLOGY REPORT*  Clinical Data: Head pain secondary to a fall.  CT HEAD WITHOUT CONTRAST  Technique:  Contiguous axial images  were obtained from the base of the skull through the vertex without contrast.  Comparison: 11/01/2011  Findings: No acute intracranial hemorrhage, infarction, or mass lesion.  Severe diffuse cerebral cortical atrophy with secondary ventricular dilatation.  Extensive periventricular white matter lucency consistent with chronic small vessel ischemic disease, unchanged.  No acute osseous abnormality.  IMPRESSION: No acute abnormalities.  Original Report Authenticated By: Gwynn Burly, M.D.   Ct Cervical Spine Wo Contrast  11/30/2011  *RADIOLOGY REPORT*  Clinical Data: Head trauma secondary to a fall.  Dementia.  CT CERVICAL SPINE WITHOUT CONTRAST  Technique:  Multidetector CT imaging of the cervical spine was performed. Multiplanar CT image reconstructions were also generated.  Comparison:  CT scan dated 11/01/2011 and 09/15/2011  Findings: Scan extends from the bottom of the clivus through T3-4.  There is an old previously diagnosed fracture through the base of the odontoid process. There is 2 mm of posterior displacement of the odontoid, unchanged.  There is no acute fracture or subluxation.  There are old compression deformities at T2 and T3, stable.  Moderate facet arthritis at C2-3 on the right and at C2-3 and C3-4 on the left.  IMPRESSION: No acute abnormality of the cervical spine.  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  Original Report Authenticated By: Gwynn Burly, M.D.       1. Head injury       MDM  Pt alert, at baseline MS.  Will d/c to f/u with PMD as needed        Rolan Bucco, MD 12/04/11 9629

## 2011-11-30 NOTE — ED Notes (Signed)
Per EMS- pt picked up from [facility] facility, with c/o fall.  Staff states she has a neurological defect that causes her to jerk and she fell and hit her head on the way down.  No LOC, and was right side lying when found.  Pt is alert and oriented per baseline. Hx of dementia and is not very talkative.  Current Bp 171/73.

## 2011-11-30 NOTE — ED Notes (Signed)
Patient transported to CT 

## 2011-11-30 NOTE — ED Notes (Signed)
[id number]<QI> Expected date:11/30/11<BR> Expected time: 2:40 PM<BR> Means of arrival:Ambulance<BR> Comments:<BR> Fall, head injury

## 2012-01-30 ENCOUNTER — Encounter (HOSPITAL_COMMUNITY): Payer: Self-pay

## 2012-01-30 ENCOUNTER — Emergency Department (HOSPITAL_COMMUNITY)
Admission: EM | Admit: 2012-01-30 | Discharge: 2012-01-30 | Disposition: A | Discharge status: Skilled Nursing Facility | Payer: No Typology Code available for payment source | Attending: Emergency Medicine | Admitting: Emergency Medicine

## 2012-01-30 ENCOUNTER — Other Ambulatory Visit: Payer: Self-pay

## 2012-01-30 DIAGNOSIS — G309 Alzheimer's disease, unspecified: Secondary | ICD-10-CM | POA: Insufficient documentation

## 2012-01-30 DIAGNOSIS — N390 Urinary tract infection, site not specified: Secondary | ICD-10-CM

## 2012-01-30 DIAGNOSIS — F0390 Unspecified dementia without behavioral disturbance: Secondary | ICD-10-CM

## 2012-01-30 DIAGNOSIS — F0280 Dementia in other diseases classified elsewhere without behavioral disturbance: Secondary | ICD-10-CM | POA: Insufficient documentation

## 2012-01-30 LAB — CBC WITH DIFFERENTIAL/PLATELET
Basophils Absolute: 0 10*3/uL (ref 0.0–0.1)
Basophils Relative: 0 % (ref 0–1)
Eosinophils Absolute: 0.1 10*3/uL (ref 0.0–0.7)
MCH: 30.4 pg (ref 26.0–34.0)
MCHC: 33 g/dL (ref 30.0–36.0)
Neutro Abs: 10.7 10*3/uL — ABNORMAL HIGH (ref 1.7–7.7)
Neutrophils Relative %: 87 % — ABNORMAL HIGH (ref 43–77)
Platelets: 256 10*3/uL (ref 150–400)

## 2012-01-30 LAB — URINALYSIS, ROUTINE W REFLEX MICROSCOPIC
Nitrite: POSITIVE — AB
Protein, ur: NEGATIVE mg/dL
Urobilinogen, UA: 0.2 mg/dL (ref 0.0–1.0)

## 2012-01-30 LAB — URINE MICROSCOPIC-ADD ON

## 2012-01-30 LAB — BASIC METABOLIC PANEL
BUN: 30 mg/dL — ABNORMAL HIGH (ref 6–23)
Chloride: 102 mEq/L (ref 96–112)
GFR calc Af Amer: 52 mL/min — ABNORMAL LOW (ref >90)
GFR calc non Af Amer: 45 mL/min — ABNORMAL LOW (ref >90)
Potassium: 4 mEq/L (ref 3.5–5.1)
Sodium: 138 mEq/L (ref 135–145)

## 2012-01-30 LAB — OCCULT BLOOD, POC DEVICE: Fecal Occult Bld: NEGATIVE

## 2012-01-30 MED ORDER — SODIUM CHLORIDE 0.9 % IV BOLUS (SEPSIS)
500.0000 mL | Freq: Once | INTRAVENOUS | Status: AC
  Administered 2012-01-30: 500 mL via INTRAVENOUS

## 2012-01-30 MED ORDER — CEPHALEXIN 500 MG PO CAPS: 500.0000 mg | ORAL_CAPSULE | Freq: Four times a day (QID) | ORAL | Status: DC

## 2012-01-30 MED ORDER — DEXTROSE 5 % IV SOLN
1.0000 g | Freq: Once | INTRAVENOUS | Status: AC
  Administered 2012-01-30: 1 g via INTRAVENOUS
  Filled 2012-01-30: qty 10

## 2012-01-30 NOTE — ED Provider Notes (Signed)
History     CSN: [id number]  Arrival date & time 01/30/12  1137   First MD Initiated Contact with Patient 01/30/12 1148    level 5 dementia   Chief Complaint  Patient presents with  . Hypotension    (Consider location/radiation/quality/duration/timing/severity/associated sxs/prior treatment) HPI Patient from nh with report of bp measured and low.  EMS reports that they checked and had normal bp.  Patient without report of fever, cough, dyspnea, nausea, vomiting, gi bleeding.  Patient with history of dementia and presents with diaper in place.  Past Medical History  Diagnosis Date  . Alzheimer's dementia   . Depressed     History reviewed. No pertinent past surgical history.  No family history on file.  History  Substance Use Topics  . Smoking status: Not on file  . Smokeless tobacco: Not on file  . Alcohol Use: Not on file    OB History    Grav Para Term Preterm Abortions TAB SAB Ect Mult Living                  Review of Systems  Unable to perform ROS   Allergies  Review of patient's allergies indicates no known allergies.  Home Medications   Current Outpatient Rx  Name Route Sig Dispense Refill  . ADULT MULTIVITAMIN W/MINERALS CH Oral Take 1 tablet by mouth daily.    Marland Kitchen OMEPRAZOLE 20 MG PO CPDR Oral Take 20 mg by mouth daily.    . SERTRALINE HCL 100 MG PO TABS Oral Take 100 mg by mouth daily.    . ACETAMINOPHEN 325 MG PO TABS Oral Take 325 mg by mouth 3 (three) times daily as needed. For pain    . POLYETHYLENE GLYCOL 3350 PO POWD Oral Take 17 g by mouth daily as needed. For constipation      BP 114/54  Pulse 62  Temp 98.8 F (37.1 C) (Rectal)  Resp 17  SpO2 100%  Physical Exam  Nursing note and vitals reviewed. Constitutional: She appears well-developed and well-nourished.  HENT:  Head: Normocephalic and atraumatic.  Right Ear: External ear normal.  Left Ear: External ear normal.  Mouth/Throat: Oropharynx is clear and moist.  Eyes:  Conjunctivae normal are normal. Pupils are equal, round, and reactive to light.  Neck: Normal range of motion.  Cardiovascular: Normal rate and regular rhythm.   Pulmonary/Chest: Effort normal and breath sounds normal.  Abdominal: Soft. Bowel sounds are normal.  Musculoskeletal: Normal range of motion.  Neurological: She is alert.  Skin: Skin is warm and dry.    ED Course  Procedures (including critical care time)  Labs Reviewed  CBC WITH DIFFERENTIAL - Abnormal; Notable for the following:    WBC 12.3 (*)     Neutrophils Relative 87 (*)     Neutro Abs 10.7 (*)     Lymphocytes Relative 5 (*)     Lymphs Abs 0.6 (*)     All other components within normal limits  BASIC METABOLIC PANEL - Abnormal; Notable for the following:    Glucose, Bld 104 (*)     BUN 30 (*)     GFR calc non Af Amer 45 (*)     GFR calc Af Amer 52 (*)     All other components within normal limits  OCCULT BLOOD, POC DEVICE  OCCULT BLOOD X 1 CARD TO LAB, STOOL   No results found.   No diagnosis found.    MDM  Patient ambulated with assist.  Orthostatic vital  signs noted and patient does not appear orthostatic. Patient with possible uti, urine to be cultured and rocephin given and will rx keflex.        Hilario Quarry, MD 01/30/12 [phone]

## 2012-01-30 NOTE — ED Notes (Signed)
Pt waiting for [facility] 

## 2012-01-30 NOTE — ED Notes (Signed)
Will discharge post rocephin antibiotic and will be transfer back to nursing home via [facility]

## 2012-01-30 NOTE — ED Notes (Signed)
[facility] is still not here yet, [facility] called again, sts they are on their way

## 2012-01-30 NOTE — ED Notes (Signed)
Pt. Was able to ambulate with 2 staff assistant. She walked well but was a little unsteady on her feet.

## 2012-01-30 NOTE — ED Notes (Signed)
RUE:[id number]<JW> Expected date:01/30/12<BR> Expected time:11:30 AM<BR> Means of arrival:Ambulance<BR> Comments:<BR> hypotensive

## 2012-01-30 NOTE — ED Notes (Signed)
[facility] called and [facility] aware this patient is going back to the facility

## 2012-01-30 NOTE — ED Notes (Signed)
incontinent care given. No change with mental status.

## 2012-01-30 NOTE — ED Notes (Signed)
[facility] stated called to [facility] due to pt. Having low bp systolic 60. However, noted wrong size bp cuff on a patientt. That is alert, skin is cool & dry. Patient normal baseline is dementia. EMS applied appropriate size cuff with normal bp. Patient presently is alert, incontinent stools no gross blood, rectal temperature done due to cool skin.

## 2012-02-02 LAB — URINE CULTURE: Colony Count: >=100000

## 2012-02-03 NOTE — ED Notes (Signed)
+   Urine Patient treated with Keflex-sensitive to same-chart appended per protocol MD. 

## 2012-06-25 ENCOUNTER — Other Ambulatory Visit: Payer: Self-pay

## 2012-06-25 ENCOUNTER — Emergency Department (HOSPITAL_COMMUNITY)

## 2012-06-25 ENCOUNTER — Encounter (HOSPITAL_COMMUNITY): Payer: Self-pay | Admitting: Emergency Medicine

## 2012-06-25 ENCOUNTER — Emergency Department (HOSPITAL_COMMUNITY)
Admission: EM | Admit: 2012-06-25 | Discharge: 2012-06-25 | Disposition: A | Discharge status: Skilled Nursing Facility | Attending: Emergency Medicine | Admitting: Emergency Medicine

## 2012-06-25 DIAGNOSIS — F0280 Dementia in other diseases classified elsewhere without behavioral disturbance: Secondary | ICD-10-CM | POA: Insufficient documentation

## 2012-06-25 DIAGNOSIS — F0390 Unspecified dementia without behavioral disturbance: Secondary | ICD-10-CM | POA: Insufficient documentation

## 2012-06-25 DIAGNOSIS — Z66 Do not resuscitate: Secondary | ICD-10-CM | POA: Insufficient documentation

## 2012-06-25 DIAGNOSIS — S0990XA Unspecified injury of head, initial encounter: Secondary | ICD-10-CM | POA: Insufficient documentation

## 2012-06-25 DIAGNOSIS — A498 Other bacterial infections of unspecified site: Secondary | ICD-10-CM | POA: Insufficient documentation

## 2012-06-25 DIAGNOSIS — Z79899 Other long term (current) drug therapy: Secondary | ICD-10-CM | POA: Insufficient documentation

## 2012-06-25 DIAGNOSIS — F329 Major depressive disorder, single episode, unspecified: Secondary | ICD-10-CM | POA: Insufficient documentation

## 2012-06-25 DIAGNOSIS — W19XXXA Unspecified fall, initial encounter: Secondary | ICD-10-CM | POA: Insufficient documentation

## 2012-06-25 DIAGNOSIS — F3289 Other specified depressive episodes: Secondary | ICD-10-CM | POA: Insufficient documentation

## 2012-06-25 DIAGNOSIS — Y939 Activity, unspecified: Secondary | ICD-10-CM | POA: Insufficient documentation

## 2012-06-25 DIAGNOSIS — S0003XA Contusion of scalp, initial encounter: Secondary | ICD-10-CM | POA: Insufficient documentation

## 2012-06-25 DIAGNOSIS — Y9210 Unspecified residential institution as the place of occurrence of the external cause: Secondary | ICD-10-CM | POA: Insufficient documentation

## 2012-06-25 DIAGNOSIS — N390 Urinary tract infection, site not specified: Secondary | ICD-10-CM | POA: Insufficient documentation

## 2012-06-25 DIAGNOSIS — G309 Alzheimer's disease, unspecified: Secondary | ICD-10-CM | POA: Insufficient documentation

## 2012-06-25 LAB — URINALYSIS, ROUTINE W REFLEX MICROSCOPIC
Bilirubin Urine: NEGATIVE
Glucose, UA: NEGATIVE mg/dL
Ketones, ur: NEGATIVE mg/dL
Nitrite: POSITIVE — AB
Protein, ur: NEGATIVE mg/dL
Specific Gravity, Urine: 1.022 (ref 1.005–1.030)
Urobilinogen, UA: 0.2 mg/dL (ref 0.0–1.0)
pH: 6.5 (ref 5.0–8.0)

## 2012-06-25 LAB — URINE MICROSCOPIC-ADD ON

## 2012-06-25 MED ORDER — CIPROFLOXACIN HCL 500 MG PO TABS
500.0000 mg | ORAL_TABLET | Freq: Once | ORAL | Status: AC
  Administered 2012-06-25: 500 mg via ORAL
  Filled 2012-06-25: qty 1

## 2012-06-25 MED ORDER — CIPROFLOXACIN HCL 500 MG PO TABS: 500.0000 mg | ORAL_TABLET | Freq: Two times a day (BID) | ORAL | Status: DC

## 2012-06-25 NOTE — ED Notes (Signed)
Dr. Kohut at bedside 

## 2012-06-25 NOTE — ED Notes (Signed)
Pt taken to son's car via wheelchair and helped into car; pt and son leaving with d/c paperwork and prescriptions; pt being taken back to nursing home by son. Pt does not show signs of acute distress upon d/c. Pts son given d/c teaching and verbalizes that he has no further questions upon d/c.

## 2012-06-25 NOTE — Progress Notes (Signed)
Room [facility] ED A-6  Zachery Conch - [facility] RN Visit-R.Daiveon Markman RN  Related admission to [facility] diagnosis of Alzheimers.   Pt is DNR code with OOF DNR on shadow chart.    Pt alert, pleasantly confused at baseline, lying on ED stretcher, without complaints of pain or discomfort.  Pt has visible bruise on R forehead/scalp area.  Son present and states he will transport pt back to [facility] facility via private car.  Son states he is trying to get pt to another facility and [facility] SW is assisting him.  Per son, this is the 7th ED visit due to falls at the facility.  Patient's home medication list is on shadow chart.   Please call [facility] @ [phone]- ask for RN Liaison or after hours,ask for on-call RN with any hospice needs.    Thank you.  Joneen Boers, RN  [facility]  Hospice Liaison

## 2012-06-25 NOTE — ED Notes (Signed)
Family at bedside confirms that this is pts baseline cognition and mentation level. Pts son informed that social work was consulted about situation and that social work said pt needs to return to nursing home that she is at and that nursing home would place her in a different place.

## 2012-06-25 NOTE — ED Notes (Signed)
Pt returned from CT and placed back on monitor.

## 2012-06-25 NOTE — ED Notes (Signed)
Per EMS: pt was found on ground at nursing home, unsure how fall occurred; pt is from [facility] and is on the Memory Care Unit there. According to staff pts confusion is her baseline. Pt has abrasion noted to right side of forehead. VS: BP 122 palpated pulse 86. Pt is not on any blood thinners.

## 2012-06-27 LAB — URINE CULTURE: Colony Count: >=100000

## 2012-06-28 NOTE — ED Notes (Signed)
+   Urine Patient treated with cipro-sensitive to same-chart appended per protocol MD. 

## 2012-06-28 NOTE — ED Provider Notes (Signed)
History    79 year old female presenting after a fall at her nursing home. Unwitnessed. Patient was on the ground. Patient has a history of dementia is unable to provide any useful history. No use of blood thinning medication. Patient is at her baseline mental status per her son who is  at bedside. No intervention prior to arrival.   CSN: [id number]  Arrival date & time 06/25/12  1122   First MD Initiated Contact with Patient 06/25/12 1138      Chief Complaint  Patient presents with  . Fall    (Consider location/radiation/quality/duration/timing/severity/associated sxs/prior treatment) HPI  Past Medical History  Diagnosis Date  . Alzheimer's dementia   . Depressed     History reviewed. No pertinent past surgical history.  History reviewed. No pertinent family history.  History  Substance Use Topics  . Smoking status: Not on file  . Smokeless tobacco: Not on file  . Alcohol Use: Not on file    OB History   Grav Para Term Preterm Abortions TAB SAB Ect Mult Living                  Review of Systems All systems reviewed and negative, other than as noted in HPI.  Allergies  Review of patient's allergies indicates no known allergies.  Home Medications   Current Outpatient Rx  Name  Route  Sig  Dispense  Refill  . acetaminophen (TYLENOL) 325 MG tablet   Oral   Take 325 mg by mouth 3 (three) times daily as needed. For pain         . Multiple Vitamin (MULITIVITAMIN WITH MINERALS) TABS   Oral   Take 1 tablet by mouth daily.         Marland Kitchen omeprazole (PRILOSEC) 20 MG capsule   Oral   Take 20 mg by mouth daily.         . polyethylene glycol powder (GLYCOLAX/MIRALAX) powder   Oral   Take 17 g by mouth daily as needed. For constipation         . sertraline (ZOLOFT) 100 MG tablet   Oral   Take 100 mg by mouth daily.         . ciprofloxacin (CIPRO) 500 MG tablet   Oral   Take 1 tablet (500 mg total) by mouth every 12 (twelve) hours.   10 tablet   0      BP 153/55  Pulse 58  Temp(Src) 98.2 F (36.8 C) (Axillary)  Resp 19  SpO2 98%  Physical Exam  Nursing note and vitals reviewed. Constitutional: She appears well-developed and well-nourished. No distress.  HENT:  Head: Normocephalic.  Ecchymosis to her right temporal and right frontal region.  Eyes: Conjunctivae are normal. Right eye exhibits no discharge. Left eye exhibits no discharge.  Neck: Neck supple.  Cardiovascular: Normal rate, regular rhythm and normal heart sounds.  Exam reveals no gallop (.review) and no friction rub.   No murmur heard. Pulmonary/Chest: Effort normal and breath sounds normal. No respiratory distress.  Abdominal: Soft. She exhibits no distension. There is no tenderness.  Musculoskeletal: She exhibits no edema and no tenderness.  No midline spinal tenderness. No apparent pain with range of motion of the large joints.  Skin: Skin is warm and dry.  Psychiatric: She has a normal mood and affect. Her behavior is normal. Thought content normal.    ED Course  Procedures (including critical care time)  Labs Reviewed  URINALYSIS, ROUTINE W REFLEX MICROSCOPIC - Abnormal; Notable for  the following:    APPearance HAZY (*)    Hgb urine dipstick TRACE (*)    Nitrite POSITIVE (*)    Leukocytes, UA MODERATE (*)    All other components within normal limits  URINE MICROSCOPIC-ADD ON - Abnormal; Notable for the following:    Bacteria, UA MANY (*)    All other components within normal limits  URINE CULTURE   No results found.   1. Closed head injury   2. Scalp contusion   3. UTI (lower urinary tract infection)   4. Fall       MDM  79 year old female presenting after a fall. Patient is her baseline mental status per her son at bedside. Neuroimaging negative for acute traumatic emergent injury. Urinalysis significant for a UTI. Will treat. Patient being discharged back to skilled nursing facility        Raeford Razor, MD 06/28/12 0011

## 2012-08-12 ENCOUNTER — Emergency Department (HOSPITAL_COMMUNITY)
Admission: EM | Admit: 2012-08-12 | Discharge: 2012-08-12 | Disposition: A | Discharge status: Skilled Nursing Facility | Attending: Emergency Medicine | Admitting: Emergency Medicine

## 2012-08-12 ENCOUNTER — Emergency Department (HOSPITAL_COMMUNITY)

## 2012-08-12 ENCOUNTER — Encounter (HOSPITAL_COMMUNITY): Payer: Self-pay

## 2012-08-12 DIAGNOSIS — S0181XA Laceration without foreign body of other part of head, initial encounter: Secondary | ICD-10-CM

## 2012-08-12 DIAGNOSIS — F3289 Other specified depressive episodes: Secondary | ICD-10-CM | POA: Insufficient documentation

## 2012-08-12 DIAGNOSIS — Z79899 Other long term (current) drug therapy: Secondary | ICD-10-CM | POA: Insufficient documentation

## 2012-08-12 DIAGNOSIS — Y9210 Unspecified residential institution as the place of occurrence of the external cause: Secondary | ICD-10-CM | POA: Insufficient documentation

## 2012-08-12 DIAGNOSIS — S0990XA Unspecified injury of head, initial encounter: Secondary | ICD-10-CM | POA: Insufficient documentation

## 2012-08-12 DIAGNOSIS — F0280 Dementia in other diseases classified elsewhere without behavioral disturbance: Secondary | ICD-10-CM | POA: Insufficient documentation

## 2012-08-12 DIAGNOSIS — IMO0002 Reserved for concepts with insufficient information to code with codable children: Secondary | ICD-10-CM | POA: Insufficient documentation

## 2012-08-12 DIAGNOSIS — G309 Alzheimer's disease, unspecified: Secondary | ICD-10-CM | POA: Insufficient documentation

## 2012-08-12 DIAGNOSIS — F329 Major depressive disorder, single episode, unspecified: Secondary | ICD-10-CM | POA: Insufficient documentation

## 2012-08-12 DIAGNOSIS — Z862 Personal history of diseases of the blood and blood-forming organs and certain disorders involving the immune mechanism: Secondary | ICD-10-CM | POA: Insufficient documentation

## 2012-08-12 DIAGNOSIS — Y9301 Activity, walking, marching and hiking: Secondary | ICD-10-CM | POA: Insufficient documentation

## 2012-08-12 DIAGNOSIS — W1809XA Striking against other object with subsequent fall, initial encounter: Secondary | ICD-10-CM | POA: Insufficient documentation

## 2012-08-12 HISTORY — DX: Anemia, unspecified: D64.9

## 2012-08-12 LAB — POCT I-STAT, CHEM 8
Calcium, Ion: 1.15 mmol/L (ref 1.13–1.30)
Creatinine, Ser: 0.9 mg/dL (ref 0.50–1.10)
Hemoglobin: 15.3 g/dL — ABNORMAL HIGH (ref 12.0–15.0)
Sodium: 143 mEq/L (ref 135–145)
TCO2: 29 mmol/L (ref 0–100)

## 2012-08-12 NOTE — ED Notes (Signed)
[facility] here to transport pt to facility 

## 2012-08-12 NOTE — ED Notes (Signed)
Pt resting in bed at this time, awaiting transport back to facility.

## 2012-08-12 NOTE — ED Notes (Signed)
[id number]> Expected date:<BR> Expected time:<BR> Means of arrival:<BR> Comments:<BR> 79yo-fall

## 2012-08-12 NOTE — ED Provider Notes (Addendum)
History    CSN: [id number] Arrival date & time 08/12/12  1024 First MD Initiated Contact with Patient 08/12/12 1030      Chief Complaint  Patient presents with  . Fall  . Head Injury    Level V caveat: Dementia HPI The patient has a history of severe dementia. She is a resident of [facility]. Patient usually walks around with a doll. This morning she was walking with her doll when she accidentally dropped it.  She leaned forward to pick it up and stumbled forward striking her head on the wall. The patient did not have loss of consciousness. This event was witnessed by the staff at the facility. She did sustain a small laceration above her right eyebrow. She was sent to the emergency room for evaluation. History is limited by the patient's dementia. She does not answer any questions Past Medical History  Diagnosis Date  . Alzheimer's dementia   . Depressed   . Anemia     History reviewed. No pertinent past surgical history.  History reviewed. No pertinent family history.  History  Substance Use Topics  . Smoking status: Not on file  . Smokeless tobacco: Not on file  . Alcohol Use: No    OB History   Grav Para Term Preterm Abortions TAB SAB Ect Mult Living                  Review of Systems  Unable to perform ROS: Dementia    Allergies  Review of patient's allergies indicates no known allergies.  Home Medications   Current Outpatient Rx  Name  Route  Sig  Dispense  Refill  . dextromethorphan-guaiFENesin (ROBITUSSIN-DM) 10-100 MG/5ML liquid   Oral   Take 10 mLs by mouth every 6 (six) hours as needed for cough.         Marland Kitchen lisinopril (PRINIVIL,ZESTRIL) 10 MG tablet   Oral   Take 10 mg by mouth daily before breakfast.         . loperamide (IMODIUM) 2 MG capsule   Oral   Take 2 mg by mouth 4 (four) times daily as needed for diarrhea or loose stools.         . Multiple Vitamin (DAILY VITE PO)   Oral   Take 1 tablet by mouth daily.         Marland Kitchen  omeprazole (PRILOSEC) 20 MG capsule   Oral   Take 20 mg by mouth daily.         . sertraline (ZOLOFT) 100 MG tablet   Oral   Take 100 mg by mouth daily before breakfast.          . acetaminophen (TYLENOL) 325 MG tablet   Oral   Take 325 mg by mouth 3 (three) times daily as needed. For pain         . polyethylene glycol powder (GLYCOLAX/MIRALAX) powder   Oral   Take 17 g by mouth daily as needed. For constipation           BP 114/60  Pulse 56  Temp(Src) 97.6 F (36.4 C) (Oral)  Resp 18  SpO2 100%  Physical Exam  Nursing note and vitals reviewed. Constitutional: No distress.  Frail  HENT:  Head: Normocephalic.  Right Ear: External ear normal.  Left Ear: External ear normal.  Small superficial laceration above the right eyebrow, fresh clot within the wound, no active bleeding  Eyes: Conjunctivae are normal. Right eye exhibits no discharge. Left eye exhibits  no discharge. No scleral icterus.  Neck: Neck supple. No spinous process tenderness present. No rigidity. No tracheal deviation present.  Cardiovascular: Normal rate, regular rhythm and intact distal pulses.   Pulmonary/Chest: Effort normal and breath sounds normal. No stridor. No respiratory distress. She has no wheezes. She has no rales.  Abdominal: Soft. Bowel sounds are normal. She exhibits no distension. There is no tenderness. There is no rebound and no guarding.  Musculoskeletal: She exhibits no edema and no tenderness.       Thoracic back: She exhibits no tenderness and no swelling.       Lumbar back: She exhibits no tenderness and no swelling.  I was able to move the patient's 4 extremities through full range of motion in all joints without apparent pain or discomfort  Neurological: She is alert. She displays no atrophy. No sensory deficit. Cranial nerve deficit:  no gross defecits noted. She exhibits normal muscle tone. She displays no seizure activity. Coordination normal. GCS eye subscore is 4. GCS  verbal subscore is 4. GCS motor subscore is 6.  The patient didn't answer her when the nurses questions by answering no, she did not speak to me at all she was awake and looking at me  Skin: Skin is warm and dry. No rash noted. She is not diaphoretic.  Psychiatric: She has a normal mood and affect.    ED Course  Procedures (including critical care time) Monitor Sinus bradycardia, rate 54, normal P waves, normal QRS, normal T waves, no ectopy noted Labs Reviewed  POCT I-STAT, CHEM 8 - Abnormal; Notable for the following:    BUN 25 (*)    Hemoglobin 15.3 (*)    All other components within normal limits   Ct Head Wo Contrast  08/12/2012  *RADIOLOGY REPORT*  Clinical Data:   Fall.  Laceration above right eye.  Demented.  CT HEAD WITHOUT CONTRAST  Technique: Contiguous axial images were obtained from the base of the skull through the vertex without contrast  Comparison: Head CT 06/25/2012.  The adjacent cervical spine imaging of 11/30/2011.  Findings:  Bone windows demonstrate right supraorbital scalp soft tissue swelling which is mild and may be chronic.  Minimal ethmoid air cell mucosal thickening.  No skull fracture. Clear mastoid air cells.  Soft tissue windows demonstrate advanced cerebral atrophy and small vessel ischemic change.  Cerebellar atrophy is also advanced. Atrophy and resultant ventriculomegaly, which is similar.  No  mass lesion, hemorrhage, hydrocephalus, acute infarct, intra- axial, or extra-axial fluid collection.  IMPRESSION:  1. No acute intracranial abnormality. 2. Cerebral atrophy and small vessel ischemic change. 3.  Mild right frontal scalp soft tissue swelling, of indeterminate acuity; similar on 06/25/2012.  CT CERVICAL SPINE WITHOUT CONTRAST  Technique: Continous axial images were obtained of the cervical spine without contrast.  Sagittal and coronal reformats were constructed.  Findings:  Spinal visualization through bottom of T3.   Moderate osteopenia. Prevertebral soft  tissues are within normal limits.  No apical pneumothorax.  Skull base intact.  Similar appearance of a chronic non-united fracture at the base the odontoid process C2.  Similar mild T2 and T3 compression deformities, without canal compromise. Facets are well-aligned.  IMPRESSION:  1.  No acute findings in the cervical spine.  2.  Chronic non-united fracture at the base of the odontoid process of C2. 3.  Similar mild T2 and T3 compression deformities.   Original Report Authenticated By: Jeronimo Greaves, M.D.    Ct Cervical Spine Wo Contrast  08/12/2012  *RADIOLOGY REPORT*  Clinical Data:   Fall.  Laceration above right eye.  Demented.  CT HEAD WITHOUT CONTRAST  Technique: Contiguous axial images were obtained from the base of the skull through the vertex without contrast  Comparison: Head CT 06/25/2012.  The adjacent cervical spine imaging of 11/30/2011.  Findings:  Bone windows demonstrate right supraorbital scalp soft tissue swelling which is mild and may be chronic.  Minimal ethmoid air cell mucosal thickening.  No skull fracture. Clear mastoid air cells.  Soft tissue windows demonstrate advanced cerebral atrophy and small vessel ischemic change.  Cerebellar atrophy is also advanced. Atrophy and resultant ventriculomegaly, which is similar.  No  mass lesion, hemorrhage, hydrocephalus, acute infarct, intra- axial, or extra-axial fluid collection.  IMPRESSION:  1. No acute intracranial abnormality. 2. Cerebral atrophy and small vessel ischemic change. 3.  Mild right frontal scalp soft tissue swelling, of indeterminate acuity; similar on 06/25/2012.  CT CERVICAL SPINE WITHOUT CONTRAST  Technique: Continous axial images were obtained of the cervical spine without contrast.  Sagittal and coronal reformats were constructed.  Findings:  Spinal visualization through bottom of T3.   Moderate osteopenia. Prevertebral soft tissues are within normal limits.  No apical pneumothorax.  Skull base intact.  Similar appearance of a  chronic non-united fracture at the base the odontoid process C2.  Similar mild T2 and T3 compression deformities, without canal compromise. Facets are well-aligned.  IMPRESSION:  1.  No acute findings in the cervical spine.  2.  Chronic non-united fracture at the base of the odontoid process of C2. 3.  Similar mild T2 and T3 compression deformities.   Original Report Authenticated By: Jeronimo Greaves, M.D.     1. Minor head injury, initial encounter   2. Forehead laceration, initial encounter      MDM  No sign of serious injury.  Pt appears to be at her baseline, history is consistent with a mechanical fall.  Laceration repaired by dermabond. 1 cm in length Tolerated well.        Celene Kras, MD 08/12/12 1610  Celene Kras, MD 08/26/12 1311

## 2012-08-12 NOTE — ED Notes (Signed)
Per EMS, Pt, from [facility], had a witnessed fall.  1" laceration noted above R eye.  Denies pain.  Hx of dementia.  Per EMS, Facility sts Pt was bending over to pick some up, lost balance and slid down a wall.  Denies LOC.  Hx of anemia and syncopal episodes.

## 2012-08-12 NOTE — ED Notes (Signed)
ZOX:[id number]<AV> Expected date:<BR> Expected time:<BR> Means of arrival:<BR> Comments:<BR> 78yo-weakness

## 2012-09-20 IMAGING — CT CT HEAD W/O CM
5 of 7 series · 17 of 40 positions shown, 19 images · non-contrast
Comparison: 09/15/2011 CT

CT HEAD

CLINICAL DATA: 85-year-old female with fall, confusion, headache
and neck pain.

CT HEAD WITHOUT CONTRAST
CT CERVICAL SPINE WITHOUT CONTRAST
TECHNIQUE: Multidetector CT imaging of the head and cervical spine
was performed following the standard protocol without intravenous
contrast.  Multiplanar CT image reconstructions of the cervical
spine were also generated.

[Series 3: recon 2: brain · axial · 0.47mm/px · z∈[-118,+2]mm · 6 of 72 slices shown, 8 images]
[im 11/72  brain]
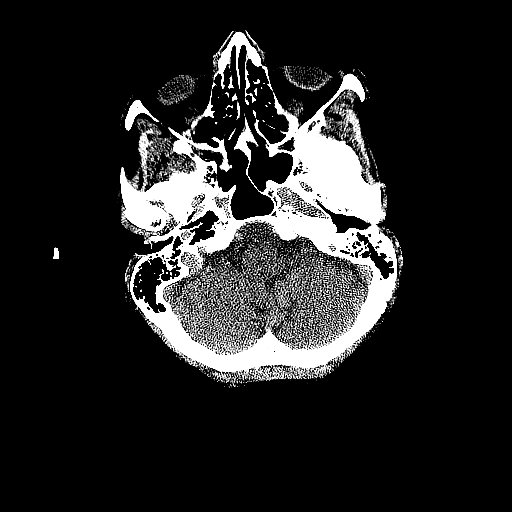
[im 11/72  bone]
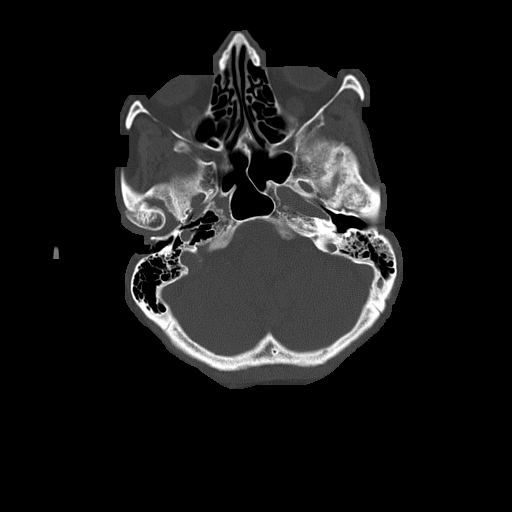
[im 21/72  brain]
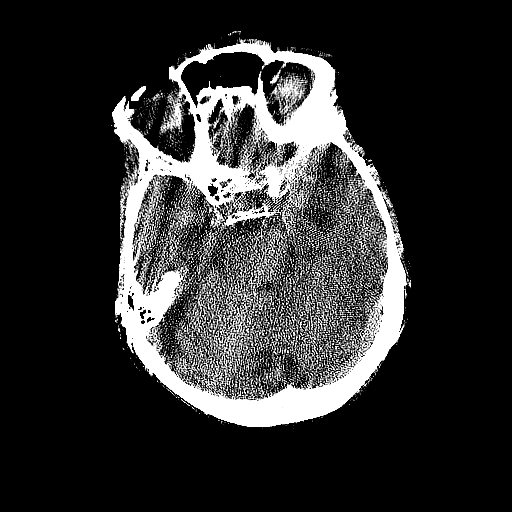
[im 31/72  brain]
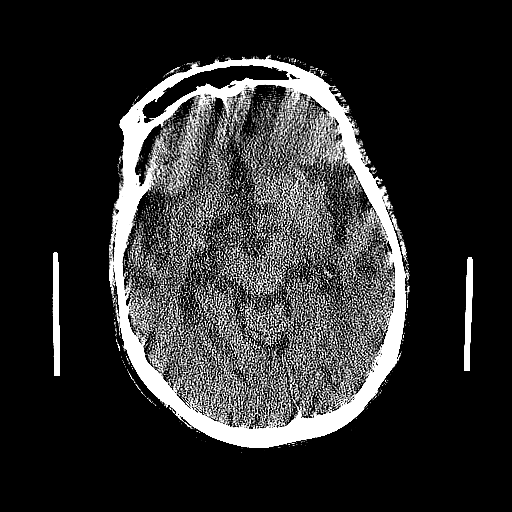
[im 41/72  brain]
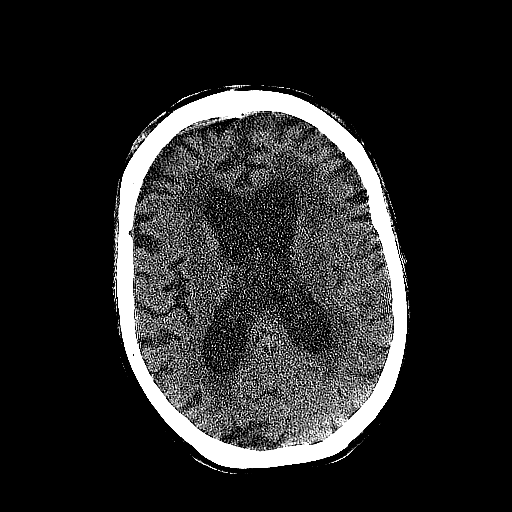
[im 51/72  brain]
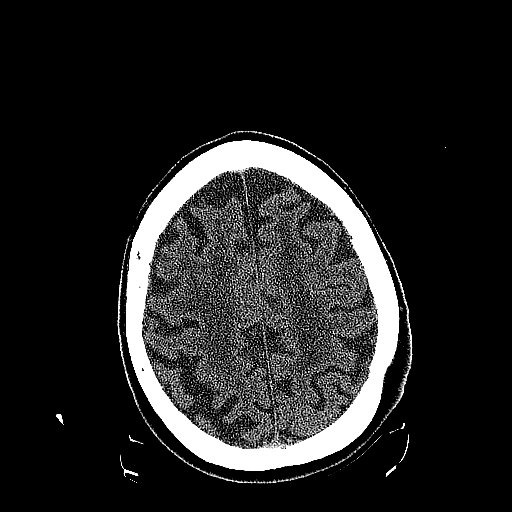
[im 51/72  bone]
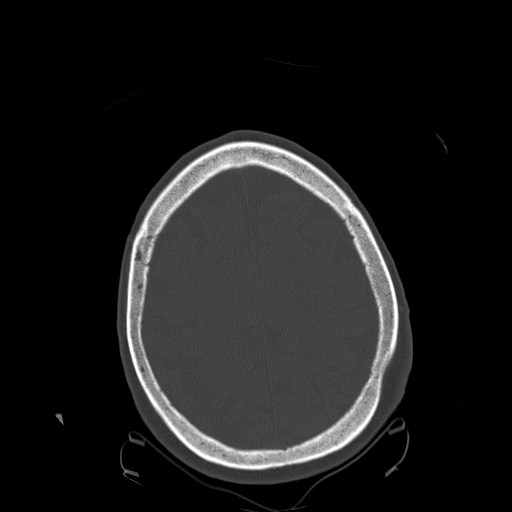
[im 61/72  brain]
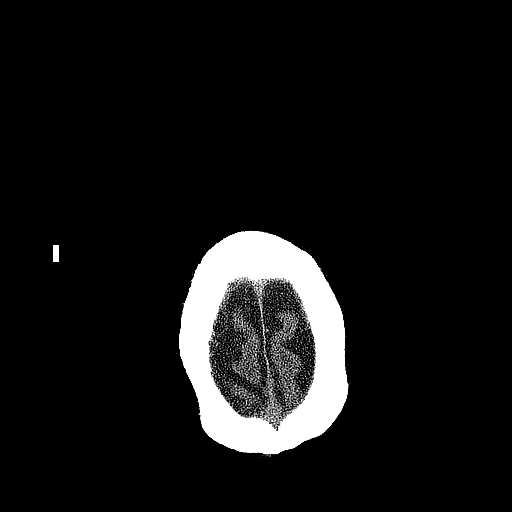

[Series 5: recon 2: c-spine · axial · 0.26mm/px · z∈[-282,-232]mm · 3 of 62 slices shown]
[im 11/62  brain]
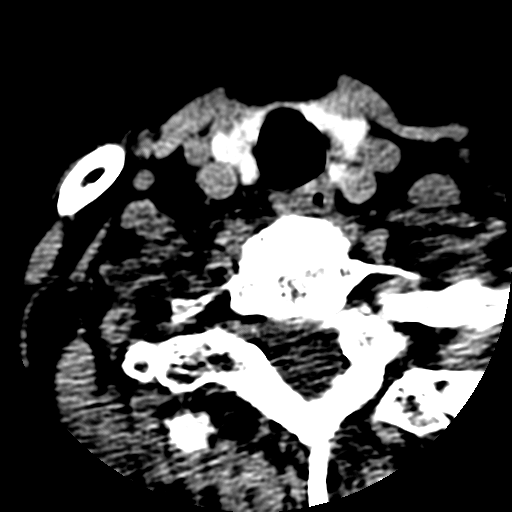
[im 21/62  brain]
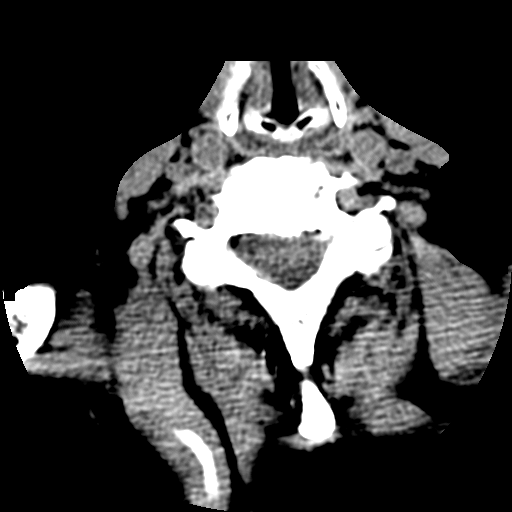
[im 31/62  brain]
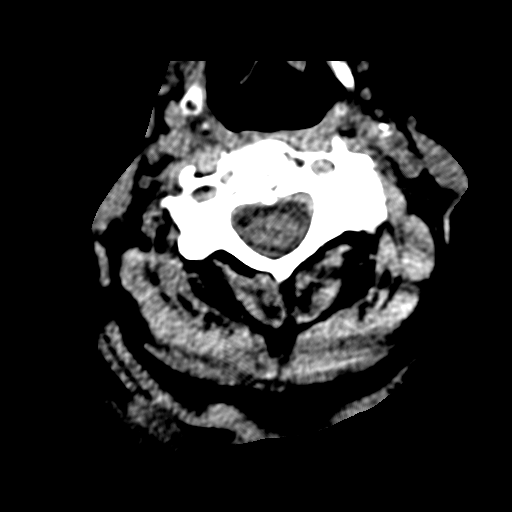

[Series 600: coronal upper · coronal · 0.31mm/px · 3 of 45 slices shown]
[im 15/45  brain]
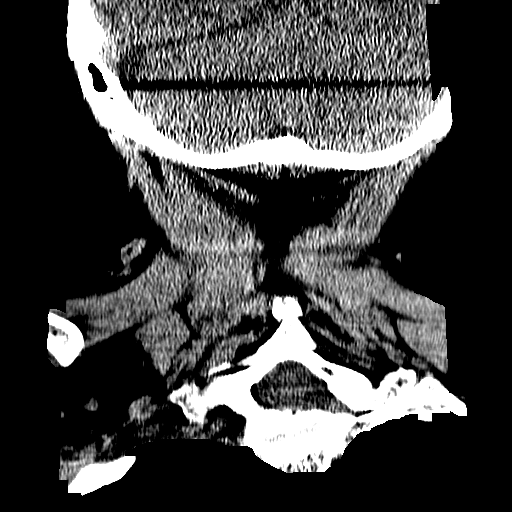
[im 20/45  brain]
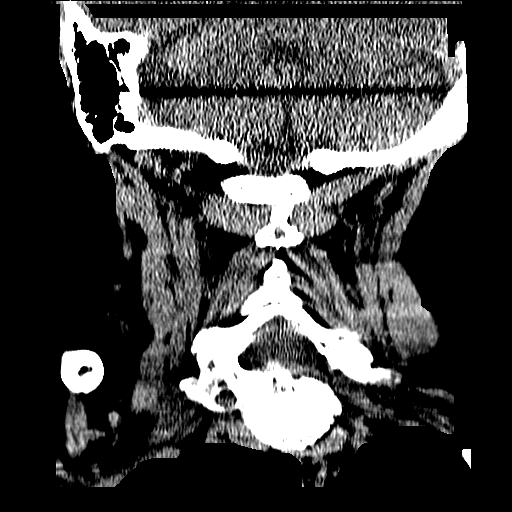
[im 25/45  brain]
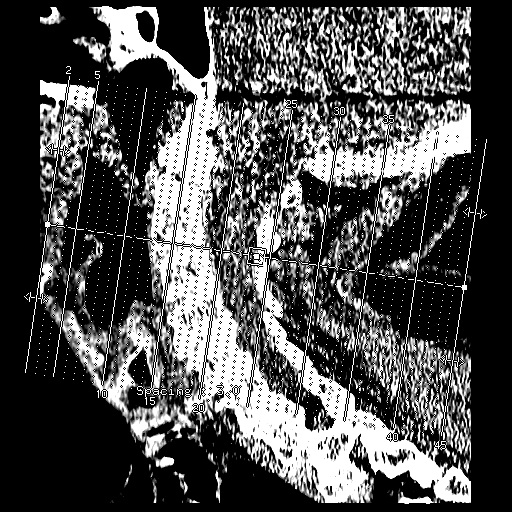

[Series 602: sagittal · sagittal · 0.31mm/px · 2 of 42 slices shown]
[im 14/42  brain]
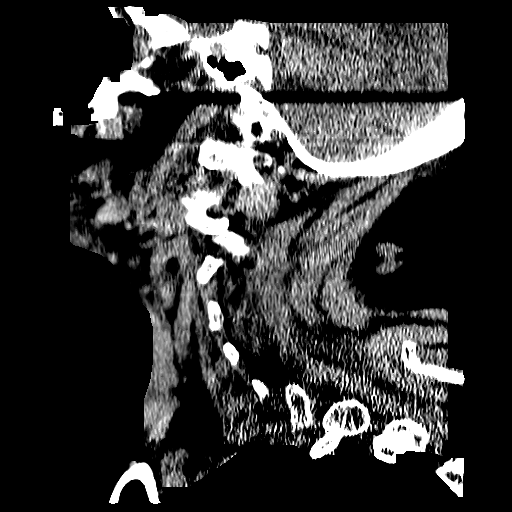
[im 28/42  brain]
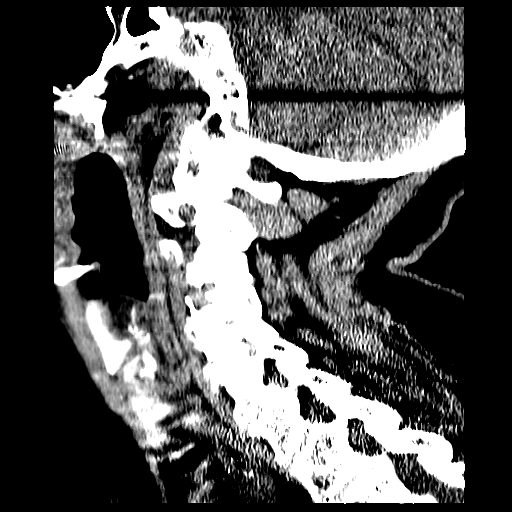

[Series 604: orthg lower · axial · 0.31mm/px · z∈[-337,-282]mm · 3 of 43 slices shown]
[im 11/43  brain]
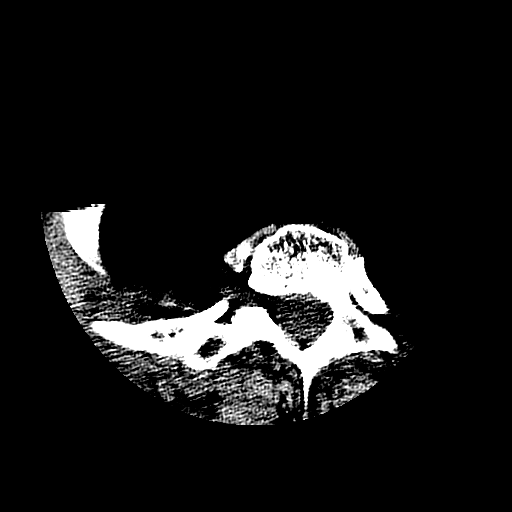
[im 22/43  brain]
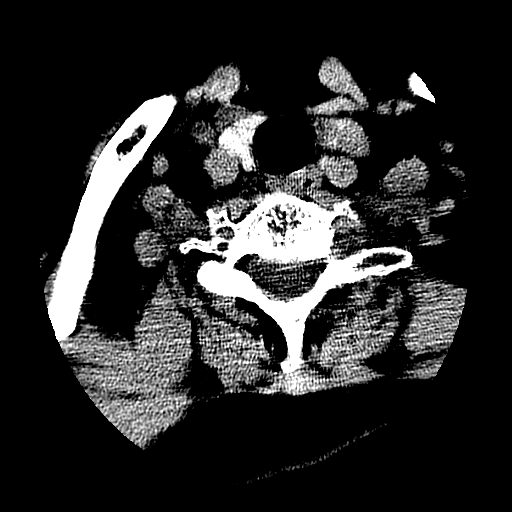
[im 32/43  brain]
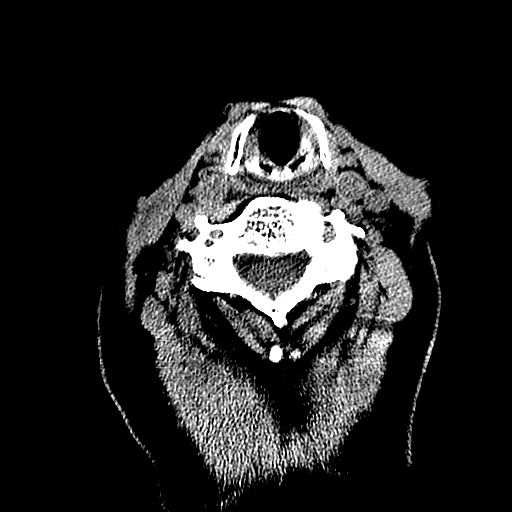

[17 of 40 positions shown; findings below may reference images not displayed]

FINDINGS: Moderate generalized cerebral atrophy and moderate to
severe chronic small vessel white matter ischemic changes are again
noted.

No acute intracranial abnormalities are identified, including mass
lesion or mass effect, hydrocephalus, extra-axial fluid collection,
midline shift, hemorrhage, or acute infarction.

The visualized bony calvarium is unremarkable. Left forehead soft
tissue swelling is identified.
IMPRESSION: No evidence of acute intracranial abnormality.

Left forehead soft tissue swelling without fracture.

Atrophy and chronic small vessel white matter ischemic changes.

CT CERVICAL SPINE
FINDINGS: A type 2 odontoid fracture with 2 mm displacement is
unchanged since 09/15/2011.
There is no evidence of new fracture, subluxation or prevertebral
soft tissue swelling.
Moderate multilevel degenerative disc disease, spondylosis and
facet arthropathy is again identified contributing to mild to
moderate central spinal and foraminal narrowing at several levels.
The soft tissue structures are unremarkable..
IMPRESSION: No evidence of acute abnormality.

Unchanged type 2 odontoid fracture with 2 mm displacement.

Multilevel degenerative changes.

## 2012-10-05 ENCOUNTER — Encounter (HOSPITAL_COMMUNITY): Payer: Self-pay | Admitting: *Deleted

## 2012-10-05 ENCOUNTER — Emergency Department (HOSPITAL_COMMUNITY)
Admission: EM | Admit: 2012-10-05 | Discharge: 2012-10-05 | Disposition: A | Discharge status: Home / Self Care | Attending: Emergency Medicine | Admitting: Emergency Medicine

## 2012-10-05 ENCOUNTER — Emergency Department (HOSPITAL_COMMUNITY)

## 2012-10-05 DIAGNOSIS — Z79899 Other long term (current) drug therapy: Secondary | ICD-10-CM | POA: Insufficient documentation

## 2012-10-05 DIAGNOSIS — F3289 Other specified depressive episodes: Secondary | ICD-10-CM | POA: Insufficient documentation

## 2012-10-05 DIAGNOSIS — F329 Major depressive disorder, single episode, unspecified: Secondary | ICD-10-CM | POA: Insufficient documentation

## 2012-10-05 DIAGNOSIS — F0280 Dementia in other diseases classified elsewhere without behavioral disturbance: Secondary | ICD-10-CM | POA: Insufficient documentation

## 2012-10-05 DIAGNOSIS — G309 Alzheimer's disease, unspecified: Secondary | ICD-10-CM | POA: Insufficient documentation

## 2012-10-05 DIAGNOSIS — D649 Anemia, unspecified: Secondary | ICD-10-CM | POA: Insufficient documentation

## 2012-10-05 DIAGNOSIS — R531 Weakness: Secondary | ICD-10-CM

## 2012-10-05 DIAGNOSIS — R5381 Other malaise: Secondary | ICD-10-CM | POA: Insufficient documentation

## 2012-10-05 DIAGNOSIS — E162 Hypoglycemia, unspecified: Secondary | ICD-10-CM | POA: Insufficient documentation

## 2012-10-05 DIAGNOSIS — N390 Urinary tract infection, site not specified: Secondary | ICD-10-CM | POA: Insufficient documentation

## 2012-10-05 HISTORY — DX: Encounter for palliative care: Z51.5

## 2012-10-05 LAB — CBC
MCH: 29.8 pg (ref 26.0–34.0)
Platelets: 334 10*3/uL (ref 150–400)
RBC: 4.46 MIL/uL (ref 3.87–5.11)
RDW: 13.8 % (ref 11.5–15.5)
WBC: 11.2 10*3/uL — ABNORMAL HIGH (ref 4.0–10.5)

## 2012-10-05 LAB — URINE MICROSCOPIC-ADD ON

## 2012-10-05 LAB — URINALYSIS, ROUTINE W REFLEX MICROSCOPIC
Glucose, UA: 100 mg/dL — AB
Hgb urine dipstick: NEGATIVE
Specific Gravity, Urine: 1.023 (ref 1.005–1.030)
pH: 6 (ref 5.0–8.0)

## 2012-10-05 LAB — COMPREHENSIVE METABOLIC PANEL
ALT: 12 U/L (ref 0–35)
AST: 19 U/L (ref 0–37)
Albumin: 3.5 g/dL (ref 3.5–5.2)
CO2: 25 mEq/L (ref 19–32)
Calcium: 9.2 mg/dL (ref 8.4–10.5)
Creatinine, Ser: 0.98 mg/dL (ref 0.50–1.10)
GFR calc non Af Amer: 52 mL/min — ABNORMAL LOW (ref >90)
Sodium: 144 mEq/L (ref 135–145)
Total Protein: 7.3 g/dL (ref 6.0–8.3)

## 2012-10-05 LAB — GLUCOSE, CAPILLARY: Glucose-Capillary: 94 mg/dL (ref 70–99)

## 2012-10-05 MED ORDER — DEXTROSE 5 % IV SOLN
1.0000 g | INTRAVENOUS | Status: DC
  Administered 2012-10-05: 1 g via INTRAVENOUS
  Filled 2012-10-05: qty 10

## 2012-10-05 MED ORDER — SODIUM CHLORIDE 0.9 % IV BOLUS (SEPSIS)
500.0000 mL | Freq: Once | INTRAVENOUS | Status: AC
  Administered 2012-10-05: 500 mL via INTRAVENOUS

## 2012-10-05 MED ORDER — SULFAMETHOXAZOLE-TRIMETHOPRIM 800-160 MG PO TABS: 1.0000 | ORAL_TABLET | Freq: Two times a day (BID) | ORAL | Status: AC

## 2012-10-05 NOTE — ED Notes (Signed)
[facility] called for transport back to facility 

## 2012-10-05 NOTE — ED Notes (Signed)
Hospice RN at bedside

## 2012-10-05 NOTE — Progress Notes (Signed)
[facility] ED #5 - Zachery Conch - [facility] RN Visit-R.Corwyn Vora RN  Related admission to [facility] diagnosis of Alzheimer's.  Pt is DNR code with OOF DNR on shadow chart.    Pt alert, confused, unable to verbalize who she is or where she is - only stated " I am...." when asked where she was.  Pt lying upright on ED stretcher without evidence of pain or discomfort.    No family present. Patient's home medication list is on shadow chart.   Per report, pt was sent from [facility] with symptoms of hypoglycemia & hypotension.  Pt has multiple bruises from a fall 3 days ago.  Please call [facility] @ [phone]- ask for RN Liaison or after hours,ask for on-call RN with any hospice needs.   Thank you.  Joneen Boers, RN  [facility]  Hospice Liaison

## 2012-10-05 NOTE — ED Notes (Signed)
Patient transported to X-ray 

## 2012-10-05 NOTE — ED Provider Notes (Addendum)
History     CSN: [id number]  Arrival date & time 10/05/12  1102   First MD Initiated Contact with Patient 10/05/12 1107      Chief Complaint  Patient presents with  . Hypoglycemia  . Hypotension    (Consider location/radiation/quality/duration/timing/severity/associated sxs/prior treatment) Patient is a 79 y.o. female presenting with hypoglycemia. The history is provided by the patient and the EMS personnel. The history is limited by the condition of the patient.  Hypoglycemia pt with hx advanced dementia, resides at [facility]. ems indicates was called re 'sick call'.  ems notes pt w bs 50, and low bp. Pt was sitting upright in chair, content appearing when they arrived. No report trauma or fall. No loc reported. No fevers. Pt w severe dementia - level 5 caveat.     Past Medical History  Diagnosis Date  . Alzheimer's dementia   . Depressed   . Anemia     No past surgical history on file.  No family history on file.  History  Substance Use Topics  . Smoking status: Not on file  . Smokeless tobacco: Not on file  . Alcohol Use: No    OB History   Grav Para Term Preterm Abortions TAB SAB Ect Mult Living                  Review of Systems  Unable to perform ROS: Dementia  level 5 caveat   Allergies  Review of patient's allergies indicates no known allergies.  Home Medications   Current Outpatient Rx  Name  Route  Sig  Dispense  Refill  . acetaminophen (TYLENOL) 500 MG tablet   Oral   Take 500 mg by mouth every 4 (four) hours as needed for pain.         Marland Kitchen alum & mag hydroxide-simeth (MAALOX/MYLANTA) 200-200-20 MG/5ML suspension   Oral   Take 30 mLs by mouth every 6 (six) hours as needed for indigestion.         Marland Kitchen guaiFENesin (ROBITUSSIN) 100 MG/5ML liquid   Oral   Take 200 mg by mouth every 6 (six) hours as needed for cough.         Marland Kitchen lisinopril (PRINIVIL,ZESTRIL) 10 MG tablet   Oral   Take 10 mg by mouth daily before breakfast.         . Multiple  Vitamin (DAILY VITE PO)   Oral   Take 1 tablet by mouth daily.         Marland Kitchen omeprazole (PRILOSEC) 20 MG capsule   Oral   Take 20 mg by mouth daily.         . sertraline (ZOLOFT) 100 MG tablet   Oral   Take 100 mg by mouth daily before breakfast.          . loperamide (IMODIUM) 2 MG capsule   Oral   Take 2 mg by mouth 4 (four) times daily as needed for diarrhea or loose stools.           There were no vitals taken for this visit.  Physical Exam  Nursing note and vitals reviewed. Constitutional: No distress.  Very thin/frail  HENT:  Old bruise noted. No acute contusion/trauma/tenderness  Eyes: Conjunctivae are normal. Pupils are equal, round, and reactive to light. No scleral icterus.  Neck: Neck supple. No tracheal deviation present.  No stiffness or rigidity  Cardiovascular: Normal rate, regular rhythm, normal heart sounds and intact distal pulses.   Pulmonary/Chest: Effort normal and breath sounds  normal. No respiratory distress. She exhibits no tenderness.  Abdominal: Soft. Normal appearance and bowel sounds are normal. She exhibits no distension and no mass. There is no tenderness. There is no rebound and no guarding.  Genitourinary:  No cva tenderness  Musculoskeletal: She exhibits no edema and no tenderness.  Neurological: She is alert.  Alert, content appearing. Moves bil ext purposefully. ems reports ms c/w pts baseline.   Skin: Skin is warm and dry. No rash noted. She is not diaphoretic.    ED Course  Procedures (including critical care time)  Results for orders placed during the hospital encounter of 10/05/12  CBC      Result Value Range   WBC 11.2 (*) 4.0 - 10.5 K/uL   RBC 4.46  3.87 - 5.11 MIL/uL   Hemoglobin 13.3  12.0 - 15.0 g/dL   HCT 16.1  09.6 - 04.5 %   MCV 92.2  78.0 - 100.0 fL   MCH 29.8  26.0 - 34.0 pg   MCHC 32.4  30.0 - 36.0 g/dL   RDW 40.9  81.1 - 91.4 %   Platelets 334  150 - 400 K/uL  COMPREHENSIVE METABOLIC PANEL      Result Value  Range   Sodium 144  135 - 145 mEq/L   Potassium 3.7  3.5 - 5.1 mEq/L   Chloride 106  96 - 112 mEq/L   CO2 25  19 - 32 mEq/L   Glucose, Bld 121 (*) 70 - 99 mg/dL   BUN 25 (*) 6 - 23 mg/dL   Creatinine, Ser 7.82  0.50 - 1.10 mg/dL   Calcium 9.2  8.4 - 95.6 mg/dL   Total Protein 7.3  6.0 - 8.3 g/dL   Albumin 3.5  3.5 - 5.2 g/dL   AST 19  0 - 37 U/L   ALT 12  0 - 35 U/L   Alkaline Phosphatase 130 (*) 39 - 117 U/L   Total Bilirubin 0.2 (*) 0.3 - 1.2 mg/dL   GFR calc non Af Amer 52 (*) >90 mL/min   GFR calc Af Amer 60 (*) >90 mL/min  TROPONIN I      Result Value Range   Troponin I <0.30  <0.30 ng/mL  URINALYSIS, ROUTINE W REFLEX MICROSCOPIC      Result Value Range   Color, Urine YELLOW  YELLOW   APPearance CLOUDY (*) CLEAR   Specific Gravity, Urine 1.023  1.005 - 1.030   pH 6.0  5.0 - 8.0   Glucose, UA 100 (*) NEGATIVE mg/dL   Hgb urine dipstick NEGATIVE  NEGATIVE   Bilirubin Urine NEGATIVE  NEGATIVE   Ketones, ur NEGATIVE  NEGATIVE mg/dL   Protein, ur NEGATIVE  NEGATIVE mg/dL   Urobilinogen, UA 1.0  0.0 - 1.0 mg/dL   Nitrite POSITIVE (*) NEGATIVE   Leukocytes, UA SMALL (*) NEGATIVE  GLUCOSE, CAPILLARY      Result Value Range   Glucose-Capillary 94  70 - 99 mg/dL   Comment 1 Notify RN    URINE MICROSCOPIC-ADD ON      Result Value Range   Squamous Epithelial / LPF FEW (*) RARE   WBC, UA 11-20  <3 WBC/hpf   Bacteria, UA MANY (*) RARE   Dg Chest 2 View  10/05/2012   *RADIOLOGY REPORT*  Clinical Data: Chest pain  CHEST - 2 VIEW  Comparison: 07/29/2009  Findings: The examination is somewhat limited by the patient's inability to adequately position herself .  The cardiac shadow is stable.  The lungs are clear bilaterally.Increased kyphosis of the thoracic spine is noted related to multiple compression deformities.  IMPRESSION: No acute abnormality is noted.   Original Report Authenticated By: Alcide Clever, M.D.       MDM  Labs. Iv ns bolus.   Reviewed nursing notes and prior  charts for additional history.    Date: 10/05/2012  Rate: 56  Rhythm: sinus bradycardia  QRS Axis: normal  Intervals: normal  ST/T Wave abnormalities: normal  Conduction Disutrbances:none  Narrative Interpretation:   Old EKG Reviewed: unchanged   uti on labs. Urine culture sent.    Rocephin iv.  Po fluids, meal.  Recheck alert and content, no distress.  bp normal, no increased wob. Eating/drinking.  Pt appears stable for d/c to [facility] w rx for uti.   On review charts last positive u cx e coli, sens rocephin, not sens to cefazolin or quinolones. Last 2 positive u cxs sens bactrim.  Will rx bactrim at e[facility].         Suzi Roots, MD 10/05/12 1428  Suzi Roots, MD 10/05/12 1435

## 2012-10-05 NOTE — ED Notes (Signed)
Per EMS, pt from [facility], staff reported to EMS that they were not sure why pt being sent out. EMS reports that pt hypoglycemic of 54 and hypotensive 80/40 upon their arrival. 22G IV inserted in L AC with IV bolus given and 12.5 D50 also given. EMS CBG of 48 at recheck. Pt arrives to ED alert and following simple commands at times, pt verbal at intervals which is baseline per EMS and nursing home staff. EMS reports that pt is receiving Hospice care for advanced Alzheimer's. Pt noted to have bruise around R eye as well as scattered bruises to arms and legs. EMS reports that pt had a fall and was treated for same about 3 days ago.

## 2012-10-05 NOTE — Progress Notes (Deleted)
[facility] ED #5 - Pamela Harvey - [facility] [place] RN Visit-R.Gissell Barra RN  Related admission to [facility] diagnosis of Alzheimer's.  Pt is DNR code with OOF DNR on shadow chart.    Pt alert, confused, unable to verbalize who she is or where she is - only stated " I am...." when asked where she was.  Pt lying upright on ED stretcher without evidence of pain or discomfort.    No family present. Patient's home medication list is on shadow chart.   Per report, pt was sent from [facility] with symptoms of hypoglycemia & hypotension.  Pt has multiple bruises from a fall 3 days ago.  Please call [facility] @ [phone]- ask for RN Liaison or after hours,ask for on-call RN with any hospice needs.   Thank you.  Pamela Harvey B Pamela Krasowski, RN  [facility]  Hospice Liaison   

## 2012-10-07 LAB — URINE CULTURE

## 2012-10-08 NOTE — ED Notes (Signed)
Post ED Visit - Positive Culture Follow-up  Culture report reviewed by antimicrobial stewardship pharmacist: []  Wes Dulaney, Pharm.D., BCPS []  Celedonio Miyamoto, Pharm.D., BCPS []  Georgina Pillion, Pharm.D., BCPS [x]  [place], [address].D., BCPS, AAHIVP []  Estella Husk, Pharm.D., BCPS, AAHIV  Positive urine culture Treated with Sulfa-Trim, organism sensitive to the same and no further patient follow-up is required at this time.  Larena Sox 10/08/2012, 3:09 PM

## 2012-11-04 ENCOUNTER — Emergency Department (HOSPITAL_COMMUNITY)
Admission: EM | Admit: 2012-11-04 | Discharge: 2012-11-04 | Disposition: A | Discharge status: Home / Self Care | Attending: Emergency Medicine | Admitting: Emergency Medicine

## 2012-11-04 DIAGNOSIS — F0280 Dementia in other diseases classified elsewhere without behavioral disturbance: Secondary | ICD-10-CM | POA: Insufficient documentation

## 2012-11-04 DIAGNOSIS — Y9210 Unspecified residential institution as the place of occurrence of the external cause: Secondary | ICD-10-CM | POA: Insufficient documentation

## 2012-11-04 DIAGNOSIS — F3289 Other specified depressive episodes: Secondary | ICD-10-CM | POA: Insufficient documentation

## 2012-11-04 DIAGNOSIS — Z862 Personal history of diseases of the blood and blood-forming organs and certain disorders involving the immune mechanism: Secondary | ICD-10-CM | POA: Insufficient documentation

## 2012-11-04 DIAGNOSIS — N390 Urinary tract infection, site not specified: Secondary | ICD-10-CM | POA: Insufficient documentation

## 2012-11-04 DIAGNOSIS — G309 Alzheimer's disease, unspecified: Secondary | ICD-10-CM | POA: Insufficient documentation

## 2012-11-04 DIAGNOSIS — F329 Major depressive disorder, single episode, unspecified: Secondary | ICD-10-CM | POA: Insufficient documentation

## 2012-11-04 DIAGNOSIS — W19XXXA Unspecified fall, initial encounter: Secondary | ICD-10-CM | POA: Insufficient documentation

## 2012-11-04 DIAGNOSIS — Z79899 Other long term (current) drug therapy: Secondary | ICD-10-CM | POA: Insufficient documentation

## 2012-11-04 DIAGNOSIS — Y939 Activity, unspecified: Secondary | ICD-10-CM | POA: Insufficient documentation

## 2012-11-04 LAB — URINALYSIS, ROUTINE W REFLEX MICROSCOPIC
Bilirubin Urine: NEGATIVE
Nitrite: NEGATIVE
Specific Gravity, Urine: 1.023 (ref 1.005–1.030)
pH: 5.5 (ref 5.0–8.0)

## 2012-11-04 LAB — BASIC METABOLIC PANEL
CO2: 27 mEq/L (ref 19–32)
Chloride: 106 mEq/L (ref 96–112)
Potassium: 3.9 mEq/L (ref 3.5–5.1)
Sodium: 143 mEq/L (ref 135–145)

## 2012-11-04 LAB — CBC
HCT: 40.7 % (ref 36.0–46.0)
MCV: 91.9 fL (ref 78.0–100.0)
RBC: 4.43 MIL/uL (ref 3.87–5.11)
WBC: 8.5 10*3/uL (ref 4.0–10.5)

## 2012-11-04 LAB — URINE MICROSCOPIC-ADD ON

## 2012-11-04 MED ORDER — NITROFURANTOIN MONOHYD MACRO 100 MG PO CAPS: 100.0000 mg | ORAL_CAPSULE | Freq: Two times a day (BID) | ORAL | Status: DC

## 2012-11-04 NOTE — ED Provider Notes (Signed)
History    CSN: [id number] Arrival date & time 11/04/12  1827  First MD Initiated Contact with Patient 11/04/12 1829     Chief Complaint  Patient presents with  . Fall   (Consider location/radiation/quality/duration/timing/severity/associated sxs/prior Treatment) Patient is a 79 y.o. female presenting with fall.  Fall This is a new problem. The current episode started today. The problem occurs constantly. The problem has been unchanged. Nothing aggravates the symptoms. She has tried nothing for the symptoms.   Past Medical History  Diagnosis Date  . Alzheimer's dementia   . Depressed   . Anemia   . Hospice care patient    No past surgical history on file. No family history on file. History  Substance Use Topics  . Smoking status: Never Smoker   . Smokeless tobacco: Never Used  . Alcohol Use: No   OB History   Grav Para Term Preterm Abortions TAB SAB Ect Mult Living                 Review of Systems  Unable to perform ROS: Dementia  All other systems reviewed and are negative.    Allergies  Review of patient's allergies indicates no known allergies.  Home Medications   Current Outpatient Rx  Name  Route  Sig  Dispense  Refill  . lisinopril (PRINIVIL,ZESTRIL) 5 MG tablet   Oral   Take 5 mg by mouth daily.         . Multiple Vitamin (MULTIVITAMIN WITH MINERALS) TABS   Oral   Take 1 tablet by mouth daily.         Marland Kitchen omeprazole (PRILOSEC) 20 MG capsule   Oral   Take 20 mg by mouth daily.         . sertraline (ZOLOFT) 50 MG tablet   Oral   Take 50 mg by mouth daily.         . nitrofurantoin, macrocrystal-monohydrate, (MACROBID) 100 MG capsule   Oral   Take 1 capsule (100 mg total) by mouth 2 (two) times daily.   10 capsule   0    BP 176/79  Pulse 64  Temp(Src) 97.6 F (36.4 C) (Oral)  Resp 16  SpO2 100% Physical Exam  Vitals reviewed. Constitutional: She appears well-developed and well-nourished.  HENT:  Head: Normocephalic.     Right Ear: External ear normal.  Left Ear: External ear normal.  Eyes: Conjunctivae and EOM are normal. Pupils are equal, round, and reactive to light.  Neck: Normal range of motion. Neck supple.  Cardiovascular: Normal rate, regular rhythm, normal heart sounds and intact distal pulses.   Pulmonary/Chest: Effort normal and breath sounds normal.  Abdominal: Soft. Bowel sounds are normal. There is no tenderness.  Musculoskeletal: Normal range of motion.  No signs of trauma or tenderness inferior to clavicles  Neurological: She is alert. She has normal strength. No sensory deficit.  No obvious focal deficits in setting of limited exam secondary to dementia  Skin: Skin is warm and dry.    ED Course  Procedures (including critical care time) Labs Reviewed  BASIC METABOLIC PANEL - Abnormal; Notable for the following:    Glucose, Bld 105 (*)    BUN 26 (*)    GFR calc non Af Amer 59 (*)    GFR calc Af Amer 69 (*)    All other components within normal limits  URINALYSIS, ROUTINE W REFLEX MICROSCOPIC - Abnormal; Notable for the following:    APPearance CLOUDY (*)  Hgb urine dipstick SMALL (*)    Leukocytes, UA MODERATE (*)    All other components within normal limits  URINE MICROSCOPIC-ADD ON - Abnormal; Notable for the following:    Bacteria, UA FEW (*)    Casts HYALINE CASTS (*)    All other components within normal limits  URINE CULTURE  CBC   Results for orders placed during the hospital encounter of 11/04/12  BASIC METABOLIC PANEL      Result Value Range   Sodium 143  135 - 145 mEq/L   Potassium 3.9  3.5 - 5.1 mEq/L   Chloride 106  96 - 112 mEq/L   CO2 27  19 - 32 mEq/L   Glucose, Bld 105 (*) 70 - 99 mg/dL   BUN 26 (*) 6 - 23 mg/dL   Creatinine, Ser 1.61  0.50 - 1.10 mg/dL   Calcium 9.1  8.4 - 09.6 mg/dL   GFR calc non Af Amer 59 (*) >90 mL/min   GFR calc Af Amer 69 (*) >90 mL/min  CBC      Result Value Range   WBC 8.5  4.0 - 10.5 K/uL   RBC 4.43  3.87 - 5.11  MIL/uL   Hemoglobin 13.5  12.0 - 15.0 g/dL   HCT 04.5  40.9 - 81.1 %   MCV 91.9  78.0 - 100.0 fL   MCH 30.5  26.0 - 34.0 pg   MCHC 33.2  30.0 - 36.0 g/dL   RDW 91.4  78.2 - 95.6 %   Platelets 283  150 - 400 K/uL  URINALYSIS, ROUTINE W REFLEX MICROSCOPIC      Result Value Range   Color, Urine YELLOW  YELLOW   APPearance CLOUDY (*) CLEAR   Specific Gravity, Urine 1.023  1.005 - 1.030   pH 5.5  5.0 - 8.0   Glucose, UA NEGATIVE  NEGATIVE mg/dL   Hgb urine dipstick SMALL (*) NEGATIVE   Bilirubin Urine NEGATIVE  NEGATIVE   Ketones, ur NEGATIVE  NEGATIVE mg/dL   Protein, ur NEGATIVE  NEGATIVE mg/dL   Urobilinogen, UA 0.2  0.0 - 1.0 mg/dL   Nitrite NEGATIVE  NEGATIVE   Leukocytes, UA MODERATE (*) NEGATIVE  URINE MICROSCOPIC-ADD ON      Result Value Range   Squamous Epithelial / LPF RARE  RARE   WBC, UA 11-20  <3 WBC/hpf   RBC / HPF 0-2  <3 RBC/hpf   Bacteria, UA FEW (*) RARE   Casts HYALINE CASTS (*) NEGATIVE   Urine-Other MUCOUS PRESENT      No results found. 1. Fall, initial encounter   2. Urinary tract infection     MDM  79 y.o. female  with pertinent PMH of dementia presents with fall, unwitnessed at nursing facility, unknown if LOC or mechanism of fall.  On EMS arrival, pt with hypertension to 200 systolic, however otherwise alert and at baseline mental status which is nonverbal alert.  No recent fevers or other illness reported by son.  Pt has obvious forehead hematoma on L brow, however otherwise is without signs of trauma or tenderness.  Pt not on anticoagulation.  UA with signs of infection, will treat with macrobid (CR normal).  Spoke with son at length re: expectations of treatment and goals of care, specifically re: head ct for trauma to head and he agreed that there is no set of circumstances in which the pt would want or he or family would want surgery, and given this scenario we will not  obtain imaging of her head.  Additionally, she is at neuro baseline without signs of  intracranial injury.  Pt dc to facility in stable condition.  Labs and imaging as above reviewed by myself and attending,Dr. Ranae Palms, with whom case was discussed.   1. Fall, initial encounter   2. Urinary tract infection       Noel Gerold, MD 11/04/12 2319

## 2012-11-04 NOTE — ED Notes (Signed)
[facility] called for transport.  

## 2012-11-04 NOTE — ED Notes (Signed)
Patient arrived via [facility] from [facility] post un-witnessed fall. Patient was found by staff on the floor. Patient has a hematoma over her left orbit, bleeding is controlled, c-collar is in place with KED.  Patient is non verbal as baseline but is alert. EMS arrived with patient's DNR form.

## 2012-11-04 NOTE — ED Provider Notes (Signed)
I saw and evaluated the patient, reviewed the resident's note and I agree with the findings and plan.  Pt fell in NH. Contusion to forehead. She has advance dementia and is hospice pt. Appears to be at her baseline. Moves all ext. Discussed care with son. Given that surgical intervention would not be an option and her baseline neuro exam, CT forgone. Son agrees with plan.   Loren Racer, MD 11/04/12 [phone]

## 2012-11-05 NOTE — ED Notes (Signed)
The ed doctor found her rx and instructions and wanted the nh notified.  The supervisor reports that the son who is the poa does nto want her receiving any rxs and id not want her to receive any xrays either.  Information given that the pt has a uti and we have a rx for her.  We will leave the rx at nurse first if someone wants to  Come get it.  She will pass on to the daytime supervisor

## 2012-11-06 LAB — URINE CULTURE

## 2013-01-02 ENCOUNTER — Emergency Department (HOSPITAL_COMMUNITY)
Admission: EM | Admit: 2013-01-02 | Discharge: 2013-01-02 | Disposition: A | Discharge status: Skilled Nursing Facility | Attending: Emergency Medicine | Admitting: Emergency Medicine

## 2013-01-02 ENCOUNTER — Emergency Department (HOSPITAL_COMMUNITY)

## 2013-01-02 ENCOUNTER — Encounter (HOSPITAL_COMMUNITY): Payer: Self-pay | Admitting: Emergency Medicine

## 2013-01-02 DIAGNOSIS — Z862 Personal history of diseases of the blood and blood-forming organs and certain disorders involving the immune mechanism: Secondary | ICD-10-CM | POA: Insufficient documentation

## 2013-01-02 DIAGNOSIS — W1809XA Striking against other object with subsequent fall, initial encounter: Secondary | ICD-10-CM | POA: Insufficient documentation

## 2013-01-02 DIAGNOSIS — F0280 Dementia in other diseases classified elsewhere without behavioral disturbance: Secondary | ICD-10-CM | POA: Insufficient documentation

## 2013-01-02 DIAGNOSIS — Y9389 Activity, other specified: Secondary | ICD-10-CM | POA: Insufficient documentation

## 2013-01-02 DIAGNOSIS — F329 Major depressive disorder, single episode, unspecified: Secondary | ICD-10-CM | POA: Insufficient documentation

## 2013-01-02 DIAGNOSIS — F3289 Other specified depressive episodes: Secondary | ICD-10-CM | POA: Insufficient documentation

## 2013-01-02 DIAGNOSIS — Z79899 Other long term (current) drug therapy: Secondary | ICD-10-CM | POA: Insufficient documentation

## 2013-01-02 DIAGNOSIS — Y9210 Unspecified residential institution as the place of occurrence of the external cause: Secondary | ICD-10-CM | POA: Insufficient documentation

## 2013-01-02 DIAGNOSIS — G309 Alzheimer's disease, unspecified: Secondary | ICD-10-CM | POA: Insufficient documentation

## 2013-01-02 DIAGNOSIS — W010XXA Fall on same level from slipping, tripping and stumbling without subsequent striking against object, initial encounter: Secondary | ICD-10-CM | POA: Insufficient documentation

## 2013-01-02 DIAGNOSIS — IMO0002 Reserved for concepts with insufficient information to code with codable children: Secondary | ICD-10-CM

## 2013-01-02 DIAGNOSIS — S0180XA Unspecified open wound of other part of head, initial encounter: Secondary | ICD-10-CM | POA: Insufficient documentation

## 2013-01-02 NOTE — ED Provider Notes (Addendum)
CSN: [id number]     Arrival date & time 01/02/13  1224 History     First MD Initiated Contact with Patient 01/02/13 1237     Chief Complaint  Patient presents with  . Fall   (Consider location/radiation/quality/duration/timing/severity/associated sxs/prior Treatment) HPI Comments: Patient presents to the ER for evaluation after a fall. Patient reportedly tripped over her roommates wheelchair, falling to the ground. She has small laceration next to her right eye. Patient is noncommunicative, cannot provide any further information. Information provided by EMS, from the nursing home staff. Level V Caveat due to dementia.  Patient is a 79 y.o. female presenting with fall.  Fall    Past Medical History  Diagnosis Date  . Alzheimer's dementia   . Depressed   . Anemia   . Hospice care patient    History reviewed. No pertinent past surgical history. No family history on file. History  Substance Use Topics  . Smoking status: Never Smoker   . Smokeless tobacco: Never Used  . Alcohol Use: No   OB History   Grav Para Term Preterm Abortions TAB SAB Ect Mult Living                 Review of Systems  Unable to perform ROS: Dementia    Allergies  Review of patient's allergies indicates no known allergies.  Home Medications   Current Outpatient Rx  Name  Route  Sig  Dispense  Refill  . acetaminophen (TYLENOL) 500 MG tablet   Oral   Take 500 mg by mouth every 6 (six) hours as needed for pain.         Marland Kitchen guaiFENesin (ROBITUSSIN) 100 MG/5ML SOLN   Oral   Take 10 mLs by mouth every 6 (six) hours as needed (for cough).         Marland Kitchen loperamide (IMODIUM A-D) 2 MG tablet   Oral   Take 2 mg by mouth 4 (four) times daily as needed for diarrhea or loose stools.         . magnesium hydroxide (MILK OF MAGNESIA) 400 MG/5ML suspension   Oral   Take 30 mLs by mouth daily as needed for constipation.         . mirtazapine (REMERON) 15 MG tablet   Oral   Take 15 mg by mouth at  bedtime.         . Multiple Vitamin (MULTIVITAMIN WITH MINERALS) TABS   Oral   Take 1 tablet by mouth daily.         Marland Kitchen omeprazole (PRILOSEC) 20 MG capsule   Oral   Take 20 mg by mouth daily.         . polyethylene glycol (MIRALAX / GLYCOLAX) packet   Oral   Take 17 g by mouth daily as needed (for constipation).         . sennosides-docusate sodium (SENOKOT-S) 8.6-50 MG tablet   Oral   Take 1 tablet by mouth daily.          BP 118/46  Pulse 66  Temp(Src) 97.4 F (36.3 C) (Oral)  Resp 17  SpO2 98% Physical Exam  Constitutional: She is oriented to person, place, and time. She appears well-developed and well-nourished. No distress.  HENT:  Head: Normocephalic and atraumatic.  Right Ear: Hearing normal.  Left Ear: Hearing normal.  Nose: Nose normal.  Mouth/Throat: Oropharynx is clear and moist and mucous membranes are normal.  Eyes: Conjunctivae and EOM are normal. Pupils are equal, round, and reactive  to light.  Neck: Normal range of motion. Neck supple.  Cardiovascular: Regular rhythm, S1 normal and S2 normal.  Exam reveals no gallop and no friction rub.   No murmur heard. Pulmonary/Chest: Effort normal and breath sounds normal. No respiratory distress. She exhibits no tenderness.  Abdominal: Soft. Normal appearance and bowel sounds are normal. There is no hepatosplenomegaly. There is no tenderness. There is no rebound, no guarding, no tenderness at McBurney's point and negative Murphy's sign. No hernia.  Musculoskeletal: Normal range of motion.  Neurological: She is alert and oriented to person, place, and time. She has normal strength. No cranial nerve deficit or sensory deficit. Coordination normal. GCS eye subscore is 4. GCS verbal subscore is 5. GCS motor subscore is 6.  Skin: Skin is warm and dry. Laceration (right forehead) noted. No rash noted. No cyanosis.  Psychiatric: She has a normal mood and affect. Her speech is normal and behavior is normal. Thought  content normal.    ED Course   Procedures (including critical care time)  LACERATION REPAIR Performed by: Gilda Crease. Authorized by: Gilda Crease Consent: Verbal consent obtained. Risks and benefits: risks, benefits and alternatives were discussed Consent given by: patient Patient identity confirmed: provided demographic data Prepped and Draped in normal sterile fashion Wound explored  Laceration Location: face  Laceration Length: 1cm No Foreign Bodies seen or palpated Anesthesia: none Irrigation method: syringe Amount of cleaning: standard Skin closure: dermabond  Patient tolerance: Patient tolerated the procedure well with no immediate complications.   Labs Reviewed - No data to display Dg Chest 1 View  01/02/2013   *RADIOLOGY REPORT*  Clinical Data: Larey Seat.  CHEST - 1 VIEW  Comparison: 10/05/2012  Findings: No definite pneumothorax or effusion.  Lungs are mildly hyperinflated with coarse interstitial markings as before.  Linear scarring or subsegmental atelectasis at the left lung base stable. Heart size upper limits normal.  Atheromatous aorta.  Osteopenia with mild degenerative change in the left shoulder.  IMPRESSION:  1.  Chronic changes as above.  No acute disease.   Original Report Authenticated By: D. Andria Rhein, MD   Dg Thoracic Spine 2 View  01/02/2013   *RADIOLOGY REPORT*  Clinical Data: Fall.  Mid back pain.  THORACIC SPINE - 3 VIEW  Comparison: Thoracic spine x-rays 07/11/2009 [facility].  Two-view chest x-ray 10/05/2012 [facility].  Findings: 12 rib-bearing thoracic vertebrae.  Exaggeration of the usual thoracic kyphosis due to multiple thoracic compression fractures including T4, T5, T6, T10, and T11.  These are felt to be osteoporotic in origin.  The compression fractures at T4, T5, and T6 are new since 2011 but were present on the chest x-ray in May, 2014.  No new thoracic spine compression fractures.   IMPRESSION: Multiple osteoporotic compression fractures of the thoracic spine including T4, T5, T6, T10, and T11, none of which are acute.  There is a resulting kyphous deformity of the thoracic spine.   Original Report Authenticated By: Hulan Saas, M.D.   Dg Lumbar Spine Complete  01/02/2013   *RADIOLOGY REPORT*  Clinical Data: Larey Seat.  LUMBAR SPINE - COMPLETE 4+ VIEW  Comparison: 07/11/2009  Findings: Diffuse osteopenia. Old compression fracture deformities of  T10, T11, T12, and L4. There is a mild central concavity of the superior endplate of L5 which was not evident on the prior study, with no overall loss of vertebral body height.  Alignment is preserved.    There is narrowing of the lumbar interspaces L1-2,  L2- 3, and L3-4 with small associated endplate spurs. Patchy aortoiliac arterial calcifications without suggestion of aneurysm.  IMPRESSION:  1.  Mild L5 superior endplate central   fracture deformity, new since 07/11/2009.   Original Report Authenticated By: D. Andria Rhein, MD   Dg Pelvis 1-2 Views  01/02/2013   *RADIOLOGY REPORT*  Clinical Data: Larey Seat.  PELVIS - 1-2 VIEW  Comparison: 07/29/2009  Findings: Diffuse osteopenia.  Negative for fracture or dislocation.  No significant osseous degenerative change.  IMPRESSION:  Osteopenia without acute abnormality.   Original Report Authenticated By: D. Andria Rhein, MD   Ct Head Wo Contrast  01/02/2013   *RADIOLOGY REPORT*  Clinical Data:  FALL.   RIGHT FRONTAL LACERATION  CT HEAD WITHOUT CONTRAST CT CERVICAL SPINE WITHOUT CONTRAST  Technique:  Multidetector CT imaging of the head and cervical spine was performed following the standard protocol without IV contrast. Multiplanar CT image reconstructions of the cervical spine were also generated.  Comparison: 08/12/2012  CT HEAD  Findings: Atherosclerotic and physiologic intracranial calcifications.  Small left frontal scalp hematoma. Diffuse parenchymal atrophy. Patchy areas of hypoattenuation in deep  and periventricular white matter bilaterally. Negative for acute intracranial hemorrhage, mass lesion, acute infarction, midline shift, or mass-effect. Acute infarct may be inapparent on noncontrast CT. Ventricles and sulci symmetric. Bone windows demonstrate no focal lesion.  IMPRESSION:  1. Negative for bleed or other acute intracranial process.  2. Atrophy and nonspecific white matter changes  CT CERVICAL SPINE  Findings: Chronic type 2 ununited dens fracture.  There is vacuum phenomenon within the fracture line and sclerosis along the fracture line.  No significant displacement of fracture fragment since previous exam.  There is no prevertebral soft tissue swelling.  There is a new mild superior endplate compression fracture deformity of T1 with approximately 30% loss of height anteriorly, no retropulsion nor posterior element involvement.  Old mild compression fracture deformity of T3 is partially seen. Facets seated.  Visualized lung apices clear.  There is calcified bilateral carotid bifurcation plaque.  Uncovertebral and facet spurring results in left foraminal encroachment C4 and C5.  IMPRESSION:  1.  New mild compression fracture deformity T1 without complicating features. 2.  Chronic ununited type 2 dens fracture, stable alignment.   Original Report Authenticated By: D. Andria Rhein, MD   Ct Cervical Spine Wo Contrast  01/02/2013   *RADIOLOGY REPORT*  Clinical Data:  FALL.   RIGHT FRONTAL LACERATION  CT HEAD WITHOUT CONTRAST CT CERVICAL SPINE WITHOUT CONTRAST  Technique:  Multidetector CT imaging of the head and cervical spine was performed following the standard protocol without IV contrast. Multiplanar CT image reconstructions of the cervical spine were also generated.  Comparison: 08/12/2012  CT HEAD  Findings: Atherosclerotic and physiologic intracranial calcifications.  Small left frontal scalp hematoma. Diffuse parenchymal atrophy. Patchy areas of hypoattenuation in deep and periventricular white  matter bilaterally. Negative for acute intracranial hemorrhage, mass lesion, acute infarction, midline shift, or mass-effect. Acute infarct may be inapparent on noncontrast CT. Ventricles and sulci symmetric. Bone windows demonstrate no focal lesion.  IMPRESSION:  1. Negative for bleed or other acute intracranial process.  2. Atrophy and nonspecific white matter changes  CT CERVICAL SPINE  Findings: Chronic type 2 ununited dens fracture.  There is vacuum phenomenon within the fracture line and sclerosis along the fracture line.  No significant displacement of fracture fragment since previous exam.  There is no prevertebral soft tissue swelling.  There is a new mild superior endplate compression fracture deformity  of T1 with approximately 30% loss of height anteriorly, no retropulsion nor posterior element involvement.  Old mild compression fracture deformity of T3 is partially seen. Facets seated.  Visualized lung apices clear.  There is calcified bilateral carotid bifurcation plaque.  Uncovertebral and facet spurring results in left foraminal encroachment C4 and C5.  IMPRESSION:  1.  New mild compression fracture deformity T1 without complicating features. 2.  Chronic ununited type 2 dens fracture, stable alignment.   Original Report Authenticated By: D. Andria Rhein, MD   Diagnosis: Facial laceration  MDM  Presents to the ER after a ground-level fall. Patient has chronic dementia and is noncommunicative. Multiple imaging studies were therefore ordered. Because of the obvious head injury (laceration next to the right eye) CT of head and cervical spine were performed. Based on the patient's mental status, cervical spine cannot be clinically cleared. CT scan did not show any acute abnormalities in the head or cervical spine. I could not determine if the patient is experiencing any back pain, although examination are as listed any significant response upon palpation over the midline. X-ray of thoracic and lumbar  spine did show multiple compression fractures the only one that is new since 2011 is at L5. It is a mild deformity and patient is not specifically tender in this region. She appears comfortable currently. This would require analgesia symptomatic, no other treatment.  The wound was treated with Dermabond. No further treatment necessary.  Gilda Crease, MD 01/02/13 1531  Gilda Crease, MD 01/15/13 0800

## 2013-01-02 NOTE — ED Notes (Signed)
Pt returned from radiology.

## 2013-01-02 NOTE — ED Notes (Signed)
Pt quietly resting at this time, equal, bilateral chest rise and fall. NAD. Will monitor

## 2013-01-02 NOTE — ED Notes (Signed)
Pt from [facility] vis EMS c/o fall. Pt tripped over her room mates wheelchair, fell from a standing position to floor. Pt has no obvious deformities, but has a small lac to R forehead. Pt in NAD

## 2013-01-02 NOTE — ED Notes (Signed)
Bed: RESB Expected date:  Expected time:  Means of arrival:  Comments: EMS-fall-[facility]

## 2013-01-02 NOTE — ED Notes (Signed)
[facility] called to transport pt back to [facility] 

## 2013-01-02 NOTE — ED Notes (Signed)
Pt pain assessment not completed d/y pt inability to answer questions. Pt mostly non-verbal and does not grimace with palpation.

## 2013-01-13 ENCOUNTER — Emergency Department (HOSPITAL_COMMUNITY)
Admission: EM | Admit: 2013-01-13 | Discharge: 2013-01-13 | Disposition: A | Discharge status: Home / Self Care | Attending: Emergency Medicine | Admitting: Emergency Medicine

## 2013-01-13 ENCOUNTER — Emergency Department (HOSPITAL_COMMUNITY)

## 2013-01-13 DIAGNOSIS — Z79899 Other long term (current) drug therapy: Secondary | ICD-10-CM | POA: Insufficient documentation

## 2013-01-13 DIAGNOSIS — W19XXXA Unspecified fall, initial encounter: Secondary | ICD-10-CM

## 2013-01-13 DIAGNOSIS — Z8659 Personal history of other mental and behavioral disorders: Secondary | ICD-10-CM | POA: Insufficient documentation

## 2013-01-13 DIAGNOSIS — R011 Cardiac murmur, unspecified: Secondary | ICD-10-CM | POA: Insufficient documentation

## 2013-01-13 DIAGNOSIS — G309 Alzheimer's disease, unspecified: Secondary | ICD-10-CM | POA: Insufficient documentation

## 2013-01-13 DIAGNOSIS — R296 Repeated falls: Secondary | ICD-10-CM | POA: Insufficient documentation

## 2013-01-13 DIAGNOSIS — F0280 Dementia in other diseases classified elsewhere without behavioral disturbance: Secondary | ICD-10-CM | POA: Insufficient documentation

## 2013-01-13 DIAGNOSIS — S51012A Laceration without foreign body of left elbow, initial encounter: Secondary | ICD-10-CM

## 2013-01-13 DIAGNOSIS — Z862 Personal history of diseases of the blood and blood-forming organs and certain disorders involving the immune mechanism: Secondary | ICD-10-CM | POA: Insufficient documentation

## 2013-01-13 DIAGNOSIS — S0003XA Contusion of scalp, initial encounter: Secondary | ICD-10-CM | POA: Insufficient documentation

## 2013-01-13 DIAGNOSIS — S51009A Unspecified open wound of unspecified elbow, initial encounter: Secondary | ICD-10-CM | POA: Insufficient documentation

## 2013-01-13 DIAGNOSIS — S0083XA Contusion of other part of head, initial encounter: Secondary | ICD-10-CM

## 2013-01-13 DIAGNOSIS — Y9210 Unspecified residential institution as the place of occurrence of the external cause: Secondary | ICD-10-CM | POA: Insufficient documentation

## 2013-01-13 DIAGNOSIS — Y9301 Activity, walking, marching and hiking: Secondary | ICD-10-CM | POA: Insufficient documentation

## 2013-01-13 LAB — CBC WITH DIFFERENTIAL/PLATELET
Basophils Absolute: 0 10*3/uL (ref 0.0–0.1)
Lymphocytes Relative: 13 % (ref 12–46)
Lymphs Abs: 1.7 10*3/uL (ref 0.7–4.0)
Neutro Abs: 10.3 10*3/uL — ABNORMAL HIGH (ref 1.7–7.7)
Neutrophils Relative %: 80 % — ABNORMAL HIGH (ref 43–77)
Platelets: 336 10*3/uL (ref 150–400)
RBC: 4.52 MIL/uL (ref 3.87–5.11)
RDW: 13 % (ref 11.5–15.5)
WBC: 13 10*3/uL — ABNORMAL HIGH (ref 4.0–10.5)

## 2013-01-13 LAB — BASIC METABOLIC PANEL
CO2: 31 mEq/L (ref 19–32)
Calcium: 9.3 mg/dL (ref 8.4–10.5)
GFR calc non Af Amer: 60 mL/min — ABNORMAL LOW (ref >90)
Glucose, Bld: 83 mg/dL (ref 70–99)
Potassium: 3.9 mEq/L (ref 3.5–5.1)
Sodium: 140 mEq/L (ref 135–145)

## 2013-01-13 LAB — URINALYSIS, ROUTINE W REFLEX MICROSCOPIC
Bilirubin Urine: NEGATIVE
Ketones, ur: NEGATIVE mg/dL
Nitrite: NEGATIVE
Specific Gravity, Urine: 1.021 (ref 1.005–1.030)

## 2013-01-13 LAB — URINE MICROSCOPIC-ADD ON

## 2013-01-13 LAB — GLUCOSE, CAPILLARY: Glucose-Capillary: 79 mg/dL (ref 70–99)

## 2013-01-13 MED ORDER — FOSFOMYCIN TROMETHAMINE 3 G PO PACK
3.0000 g | PACK | Freq: Once | ORAL | Status: AC
  Administered 2013-01-13: 3 g via ORAL
  Filled 2013-01-13: qty 3

## 2013-01-13 NOTE — ED Notes (Signed)
Bed: YN82 Expected date:  Expected time:  Means of arrival:  Comments: ems- 79 yo fall, no LOC

## 2013-01-13 NOTE — ED Notes (Signed)
Patient returned from CT

## 2013-01-13 NOTE — ED Provider Notes (Signed)
CSN: [id number]     Arrival date & time 01/13/13  0807 History   First MD Initiated Contact with Patient 01/13/13 [id number]     Chief Complaint  Patient presents with  . Fall  . hematoma to head    (Consider location/radiation/quality/duration/timing/severity/associated sxs/prior Treatment) HPI Patient presents from her nursing facility after a witnessed fall. Prior to arrival the patient fell while walking down a hallway. No report of loss of consciousness. Since the event the patient has not been ambulatory. The patient has dementia. Per report she is behaving at baseline. Patient denies pain, though she is essentially nonverbal.  Past Medical History  Diagnosis Date  . Alzheimer's dementia   . Depressed   . Anemia   . Hospice care patient    No past surgical history on file. No family history on file. History  Substance Use Topics  . Smoking status: Never Smoker   . Smokeless tobacco: Never Used  . Alcohol Use: No   OB History   Grav Para Term Preterm Abortions TAB SAB Ect Mult Living                 Review of Systems  Unable to perform ROS: Dementia    Allergies  Review of patient's allergies indicates no known allergies.  Home Medications   Current Outpatient Rx  Name  Route  Sig  Dispense  Refill  . acetaminophen (TYLENOL) 500 MG tablet   Oral   Take 500 mg by mouth every 6 (six) hours as needed for pain.         Marland Kitchen guaiFENesin (ROBITUSSIN) 100 MG/5ML SOLN   Oral   Take 10 mLs by mouth every 6 (six) hours as needed (for cough).         Marland Kitchen loperamide (IMODIUM A-D) 2 MG tablet   Oral   Take 2 mg by mouth 4 (four) times daily as needed for diarrhea or loose stools.         . magnesium hydroxide (MILK OF MAGNESIA) 400 MG/5ML suspension   Oral   Take 30 mLs by mouth daily as needed for constipation.         . mirtazapine (REMERON) 15 MG tablet   Oral   Take 15 mg by mouth at bedtime.         . Multiple Vitamin (MULTIVITAMIN WITH MINERALS) TABS   Oral   Take 1 tablet by mouth daily.         Marland Kitchen omeprazole (PRILOSEC) 20 MG capsule   Oral   Take 20 mg by mouth daily.         . polyethylene glycol (MIRALAX / GLYCOLAX) packet   Oral   Take 17 g by mouth daily as needed (for constipation).         . sennosides-docusate sodium (SENOKOT-S) 8.6-50 MG tablet   Oral   Take 1 tablet by mouth daily.          BP 174/83  Pulse 60  Temp(Src) 97.3 F (36.3 C) (Oral)  Resp 20  SpO2 100% Physical Exam  Nursing note and vitals reviewed. Constitutional: She appears cachectic. She appears ill.  HENT:  Head: Normocephalic.    Eyes: Conjunctivae are normal. Right eye exhibits no discharge. Left eye exhibits no discharge.  Neck:  Patient moves the neck freely, denies pain with palpation of the midline.  Cardiovascular: Normal rate and regular rhythm.   Murmur heard. Pulmonary/Chest: Effort normal. No respiratory distress.  Abdominal: She exhibits no distension.  Musculoskeletal:  There is a skin tear on the left elbow laterally.  Otherwise there are no gross deformities, patient was Alzheimer's, minimally, spontaneously.  The pelvis is stable.  Patient moves both legs without hesitation to  Neurological: She is alert.  Patient was drawn, noncontributory and exam.  However, she was also a spontaneously, has spontaneous appropriate extraocular eye motion, speech is clear, when offered.  Skin: Skin is dry.  Psychiatric: She is slowed and withdrawn. Cognition and memory are impaired.    ED Course  Procedures (including critical care time) Labs Review Labs Reviewed  GLUCOSE, CAPILLARY  CBC WITH DIFFERENTIAL  BASIC METABOLIC PANEL  URINALYSIS, ROUTINE W REFLEX MICROSCOPIC   Imaging Review No results found.A review of the patient's chart demonstrates that she was here last month following a similar fall. At that point she had multiple radiographic studies performed.  There was new evidence of compression fractures.  10:59  AM No new complaints. Patient's x-rays, labs, consistent for her. With the patient's moving her neck freely, the absence of neurologic findings, additional CT imaging of her neck was not conducted. With leukocytes,  Patient will receive fosfomycin prior to discharge  11:15 AM Patient asleep  MDM  No diagnosis found. This elderly female with dementia presents from her nursing facility following a witnessed fall.  With V. facial trauma, head CT was performed.  Patient has no gross neurologic deficits, though she does have a history of dementia.  The patient's evaluation here demonstrates possible UTI for which she received treatment.  Absent distress, with stable vital signs, with no appreciable gross neurologic dysfunction, patient was returned to her monitored facility.    Gerhard Munch, MD 01/13/13 1115

## 2013-01-13 NOTE — ED Notes (Addendum)
Per ems pt is from [facility], witness fall by staff, pt was walking down hallway, denies LOC. Pt hx of dementia. Hematoma to forehead, skin abrasion to left arm and left knee. Alert and oriented to baseline. Denies pain.

## 2013-01-13 NOTE — ED Notes (Signed)
Patient transported to CT 

## 2013-01-14 LAB — URINE CULTURE

## 2013-01-17 ENCOUNTER — Emergency Department (HOSPITAL_COMMUNITY)
Admission: EM | Admit: 2013-01-17 | Discharge: 2013-01-18 | Disposition: A | Discharge status: Skilled Nursing Facility | Attending: Emergency Medicine | Admitting: Emergency Medicine

## 2013-01-17 ENCOUNTER — Encounter (HOSPITAL_COMMUNITY): Payer: Self-pay | Admitting: Emergency Medicine

## 2013-01-17 DIAGNOSIS — S80212A Abrasion, left knee, initial encounter: Secondary | ICD-10-CM

## 2013-01-17 DIAGNOSIS — Y939 Activity, unspecified: Secondary | ICD-10-CM | POA: Insufficient documentation

## 2013-01-17 DIAGNOSIS — W19XXXA Unspecified fall, initial encounter: Secondary | ICD-10-CM | POA: Insufficient documentation

## 2013-01-17 DIAGNOSIS — G309 Alzheimer's disease, unspecified: Secondary | ICD-10-CM | POA: Insufficient documentation

## 2013-01-17 DIAGNOSIS — S0083XA Contusion of other part of head, initial encounter: Secondary | ICD-10-CM

## 2013-01-17 DIAGNOSIS — Z8659 Personal history of other mental and behavioral disorders: Secondary | ICD-10-CM | POA: Insufficient documentation

## 2013-01-17 DIAGNOSIS — Z862 Personal history of diseases of the blood and blood-forming organs and certain disorders involving the immune mechanism: Secondary | ICD-10-CM | POA: Insufficient documentation

## 2013-01-17 DIAGNOSIS — S0003XA Contusion of scalp, initial encounter: Secondary | ICD-10-CM | POA: Insufficient documentation

## 2013-01-17 DIAGNOSIS — I10 Essential (primary) hypertension: Secondary | ICD-10-CM | POA: Insufficient documentation

## 2013-01-17 DIAGNOSIS — Y9210 Unspecified residential institution as the place of occurrence of the external cause: Secondary | ICD-10-CM | POA: Insufficient documentation

## 2013-01-17 DIAGNOSIS — F0280 Dementia in other diseases classified elsewhere without behavioral disturbance: Secondary | ICD-10-CM | POA: Insufficient documentation

## 2013-01-17 DIAGNOSIS — IMO0002 Reserved for concepts with insufficient information to code with codable children: Secondary | ICD-10-CM | POA: Insufficient documentation

## 2013-01-17 NOTE — ED Notes (Signed)
Per EMS: Pt from [facility]. Staff walked by pt's door and saw her sitting on floor. Unwitnessed fall.Pt has small skin tear to left knee, both knees look bruised. Two hematomas on forehead, one on right is from tonight, one on left is from 3 days ago when she fell. Swelling to right ankle. Pt has advanced dementia, not oriented to anything except maybe name. Pt has DNR.

## 2013-01-17 NOTE — ED Notes (Signed)
Bed: WA08 Expected date: 01/17/13 Expected time: 11:01 PM Means of arrival: Ambulance Comments: Found beside bed

## 2013-01-18 ENCOUNTER — Encounter (HOSPITAL_COMMUNITY): Payer: Self-pay | Admitting: Radiology

## 2013-01-18 ENCOUNTER — Emergency Department (HOSPITAL_COMMUNITY)

## 2013-01-18 NOTE — ED Notes (Signed)
[facility] called for transport back to [facility].  

## 2013-01-18 NOTE — ED Provider Notes (Signed)
CSN: [id number]     Arrival date & time 01/17/13  2318 History   First MD Initiated Contact with Patient 01/17/13 2324     Chief Complaint  Patient presents with  . Fall   (Consider location/radiation/quality/duration/timing/severity/associated sxs/prior Treatment) HPI 79 year old female presents to emergency room from her nursing facility with reported fall.  Nursing staff found her sitting on the floor.  She had an unwitnessed fall.  Patient recently seen in the emergency department in the past week for fall.  She has a healing hematoma to left side of the head, and now has a hematoma to the right side of her head.  Patient has severe dementia, cannot answer any questions.  Patient is a hospice patient.  EMS reports in route she was severely hypoxic down into the 70s.  Nursing staff here has placed an oxygen probe on her ear with normal oxygen saturations on room air.  Patient noted be hypertensive, it is noted that she had similar readings during her last ED visit.  Level V caveat due to dementia Past Medical History  Diagnosis Date  . Alzheimer's dementia   . Depressed   . Anemia   . Hospice care patient    History reviewed. No pertinent past surgical history. History reviewed. No pertinent family history. History  Substance Use Topics  . Smoking status: Never Smoker   . Smokeless tobacco: Never Used  . Alcohol Use: No   OB History   Grav Para Term Preterm Abortions TAB SAB Ect Mult Living                 Review of Systems  Unable to perform ROS: Dementia    Allergies  Review of patient's allergies indicates no known allergies.  Home Medications   Current Outpatient Rx  Name  Route  Sig  Dispense  Refill  . mirtazapine (REMERON) 15 MG tablet   Oral   Take 15 mg by mouth at bedtime.         . sennosides-docusate sodium (SENOKOT-S) 8.6-50 MG tablet   Oral   Take 1 tablet by mouth at bedtime.          Marland Kitchen acetaminophen (TYLENOL) 500 MG tablet   Oral   Take 500 mg  by mouth every 6 (six) hours as needed for pain.         Marland Kitchen guaiFENesin (ROBITUSSIN) 100 MG/5ML SOLN   Oral   Take 10 mLs by mouth every 6 (six) hours as needed (for cough).         Marland Kitchen loperamide (IMODIUM A-D) 2 MG tablet   Oral   Take 2 mg by mouth 4 (four) times daily as needed for diarrhea or loose stools.         . magnesium hydroxide (MILK OF MAGNESIA) 400 MG/5ML suspension   Oral   Take 30 mLs by mouth daily as needed for constipation.         . polyethylene glycol (MIRALAX / GLYCOLAX) packet   Oral   Take 17 g by mouth daily as needed (for constipation).          BP 172/97  Pulse 82  Resp 16  SpO2 100% Physical Exam  Nursing note and vitals reviewed. Constitutional: She appears well-developed and well-nourished. No distress.  HENT:  Head: Normocephalic and atraumatic.  Right Ear: External ear normal.  Left Ear: External ear normal.  Nose: Nose normal.  Mouth/Throat: Oropharynx is clear and moist.  Bruising noted to forehead, bruising on  the left yellowing and appears older bruising on the right appears newer  Eyes: Conjunctivae and EOM are normal. Pupils are equal, round, and reactive to light.  Neck: Normal range of motion. Neck supple. No JVD present. No tracheal deviation present. No thyromegaly present.  Cardiovascular: Normal rate, regular rhythm, normal heart sounds and intact distal pulses.  Exam reveals no gallop and no friction rub.   No murmur heard. Pulmonary/Chest: Effort normal and breath sounds normal. No stridor. No respiratory distress. She has no wheezes. She has no rales. She exhibits no tenderness.  Abdominal: Soft. Bowel sounds are normal. She exhibits no distension and no mass. There is no tenderness. There is no rebound and no guarding.  Musculoskeletal: Normal range of motion. She exhibits no edema and no tenderness.  Abrasion to left knee.  Patient does not follow commands.  All extremities were gently ranged, no resistance.  No crepitus,  no obvious pain with range of motion at each joint of the 4 extremities  Lymphadenopathy:    She has no cervical adenopathy.  Neurological: She is alert. She exhibits normal muscle tone.  Skin: Skin is warm and dry. No rash noted. No erythema. No pallor.  Psychiatric: She has a normal mood and affect. Her behavior is normal. Judgment and thought content normal.    ED Course  Procedures (including critical care time) Labs Review Labs Reviewed - No data to display Imaging Review Ct Head Wo Contrast  01/18/2013   *RADIOLOGY REPORT*  Clinical Data:  Unwitnessed fall.  Skin tear to the left knee and knees are bruised.  Bilateral hematomas on the forehead.  CT HEAD WITHOUT CONTRAST CT CERVICAL SPINE WITHOUT CONTRAST  Technique:  Multidetector CT imaging of the head and cervical spine was performed following the standard protocol without intravenous contrast.  Multiplanar CT image reconstructions of the cervical spine were also generated.  Comparison:  CT head 01/13/2013.  CT head and cervical spine 01/02/2013.  CT HEAD  Findings: Diffuse cerebral atrophy.  Ventricular dilatation consistent with central atrophy.  Low attenuation changes in the deep white matter consistent with small vessel ischemia.  No mass effect or midline shift.  No abnormal extra-axial fluid collections.  Gray-white matter junctions are distinct.  Basal cisterns are not effaced.  No acute intracranial hemorrhage. Subcutaneous scalp hematomas over the anterior frontal regions bilaterally.  No underlying skull fractures.  Visualized paranasal sinuses and mastoid air cells are not opacified.  Vascular calcifications.  No significant changes since the previous study.  IMPRESSION: No acute intracranial abnormalities.  Chronic atrophy and small vessel ischemic changes.  Frontal subcutaneous scalp hematomas.  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  There is mild retrolisthesis  of the left lateral mass of C1 with respect to C2.  There is apparent fusion of the odontoid fracture fragment to the anterior arch of C1.  These changes are stable since the previous study.  There is otherwise normal alignment of the cervical spine.  Degenerative changes are present throughout the cervical spine with narrowed cervical interspaces and associated endplate hypertrophic changes. Degenerative changes in the facet joints.  Normal alignment of the facet joints.  Diffuse bone demineralization.  Compression deformities of T1 and T2 vertebra are stable since previous study. Mild anterior wedging of C5 is stable.  No new destructive bone lesions are appreciated.  No prevertebral soft tissue swelling. Vascular calcifications in the cervical carotid arteries. Emphysematous changes in  the lung apices.  IMPRESSION: No acute displaced fractures identified in the cervical spine. 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  Diffuse degenerative change. Old compression deformities of C5, T1, and T2.   Original Report Authenticated By: Burman Nieves, M.D.   Ct Cervical Spine Wo Contrast  01/18/2013   *RADIOLOGY REPORT*  Clinical Data:  Unwitnessed fall.  Skin tear to the left knee and knees are bruised.  Bilateral hematomas on the forehead.  CT HEAD WITHOUT CONTRAST CT CERVICAL SPINE WITHOUT CONTRAST  Technique:  Multidetector CT imaging of the head and cervical spine was performed following the standard protocol without intravenous contrast.  Multiplanar CT image reconstructions of the cervical spine were also generated.  Comparison:  CT head 01/13/2013.  CT head and cervical spine 01/02/2013.  CT HEAD  Findings: Diffuse cerebral atrophy.  Ventricular dilatation consistent with central atrophy.  Low attenuation changes in the deep white matter consistent with small vessel ischemia.  No mass effect or midline shift.  No abnormal extra-axial fluid  collections.  Gray-white matter junctions are distinct.  Basal cisterns are not effaced.  No acute intracranial hemorrhage. Subcutaneous scalp hematomas over the anterior frontal regions bilaterally.  No underlying skull fractures.  Visualized paranasal sinuses and mastoid air cells are not opacified.  Vascular calcifications.  No significant changes since the previous study.  IMPRESSION: No acute intracranial abnormalities.  Chronic atrophy and small vessel ischemic changes.  Frontal subcutaneous scalp hematomas.  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  There is mild retrolisthesis of the left lateral mass of C1 with respect to C2.  There is apparent fusion of the odontoid fracture fragment to the anterior arch of C1.  These changes are stable since the previous study.  There is otherwise normal alignment of the cervical spine.  Degenerative changes are present throughout the cervical spine with narrowed cervical interspaces and associated endplate hypertrophic changes. Degenerative changes in the facet joints.  Normal alignment of the facet joints.  Diffuse bone demineralization.  Compression deformities of T1 and T2 vertebra are stable since previous study. Mild anterior wedging of C5 is stable.  No new destructive bone lesions are appreciated.  No prevertebral soft tissue swelling. Vascular calcifications in the cervical carotid arteries. Emphysematous changes in the lung apices.  IMPRESSION: No acute displaced fractures identified in the cervical spine. 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  Diffuse degenerative change. Old compression deformities of C5, T1, and T2.   Original Report Authenticated By: Burman Nieves, M.D.    MDM   1. Fall at nursing home, initial encounter   2. Facial contusion, initial encounter   3. Knee abrasion, left, initial encounter   4.  Hypertension    79 year old female with suspected fall, with new bruising to her forehead area given her noncommunicative state, we'll get CT head and C-spine.  Will have nursing redressed her wounds.  Patient noted be hypertensive, and was so our last visit.  Will recommend she have this followed up by a primary care Dr.    Olivia Mackie, MD 01/18/13 [id number]

## 2013-10-10 DEATH — deceased

## 2015-01-11 DEATH — deceased

## 2021-03-12 DEATH — deceased
